# Patient Record
Sex: Female | Born: 1970 | Race: White | Hispanic: No | Marital: Married | Smoking: Never smoker
Health system: Southern US, Community
[De-identification: ages and names within clinical notes are randomized; demographics above are authoritative.]

## PROBLEM LIST (undated history)

## (undated) DIAGNOSIS — T7840XA Allergy, unspecified, initial encounter: Secondary | ICD-10-CM

## (undated) DIAGNOSIS — I10 Essential (primary) hypertension: Secondary | ICD-10-CM

## (undated) HISTORY — DX: Allergy, unspecified, initial encounter: T78.40XA

## (undated) HISTORY — DX: Essential (primary) hypertension: I10

## (undated) HISTORY — PX: LIPOSUCTION: SHX10

---

## 2007-04-13 ENCOUNTER — Ambulatory Visit: Payer: Self-pay | Admitting: Internal Medicine

## 2010-09-08 ENCOUNTER — Ambulatory Visit: Payer: Self-pay | Admitting: Internal Medicine

## 2011-04-28 ENCOUNTER — Ambulatory Visit: Payer: Self-pay

## 2012-01-20 ENCOUNTER — Emergency Department: Payer: Self-pay | Admitting: Emergency Medicine

## 2012-09-07 ENCOUNTER — Ambulatory Visit: Payer: Self-pay

## 2012-09-18 ENCOUNTER — Ambulatory Visit: Payer: Self-pay

## 2012-12-20 ENCOUNTER — Ambulatory Visit: Payer: Self-pay

## 2013-06-27 ENCOUNTER — Ambulatory Visit: Payer: Self-pay

## 2014-03-08 IMAGING — US ULTRASOUND RIGHT BREAST
1 series · 14 of 20 positions shown · non-contrast
Comparison: none

REASON FOR EXAM: av rt asymmetric density
COMMENTS:

PROCEDURE:     US  - US BREAST RIGHT  - September 18, 2012 [DATE]
RESULT:

[Series 1: ultrasound right breast · 0.08mm/px · 14 of 20 slices shown]
[im 1/20]
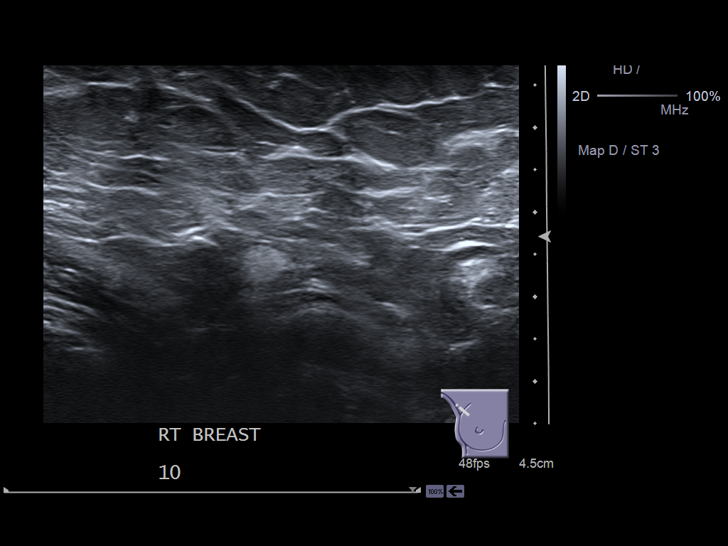
[im 3/20]
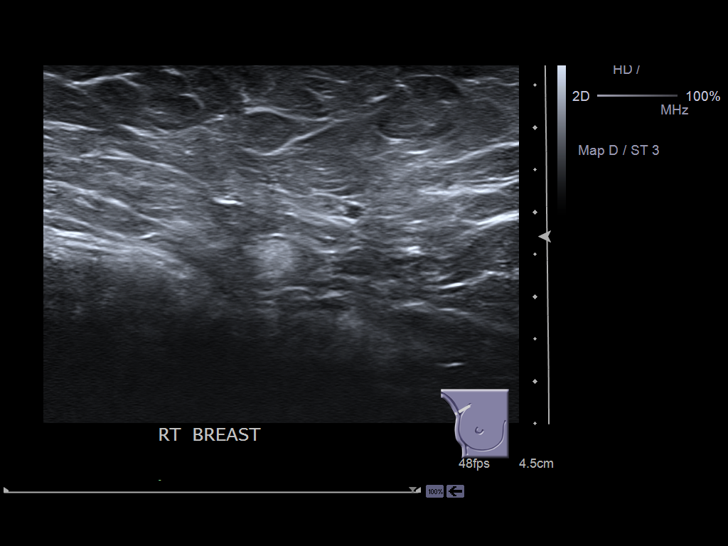
[im 4/20]
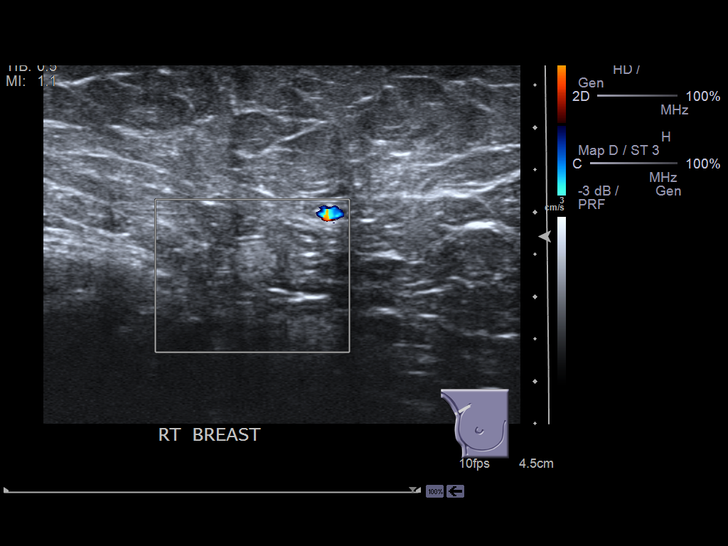
[im 6/20]
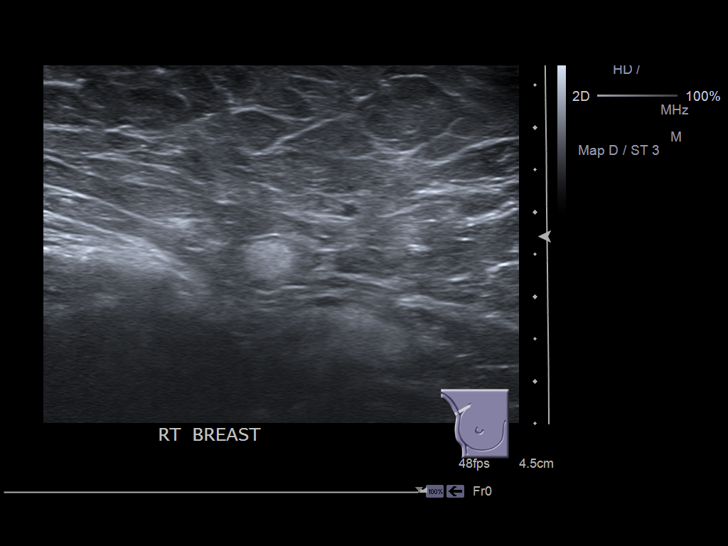
[im 7/20]
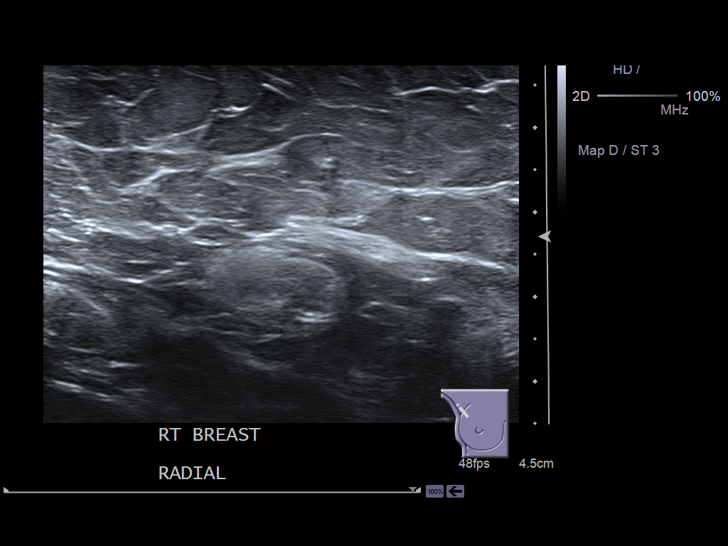
[im 8/20]
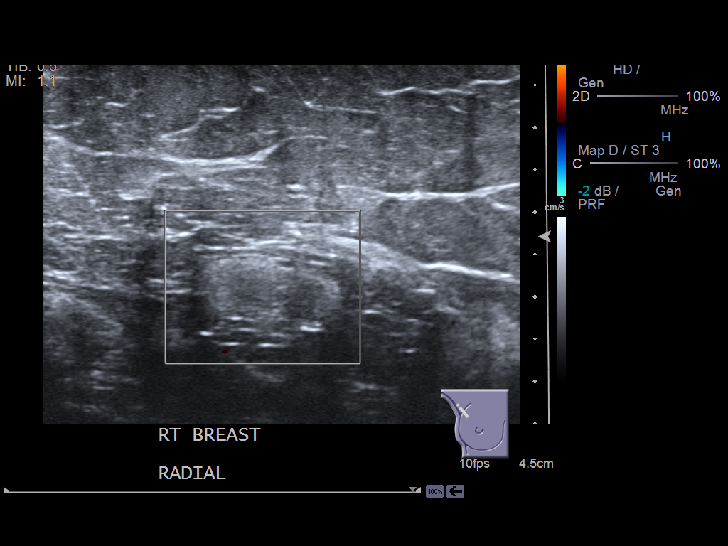
[im 10/20]
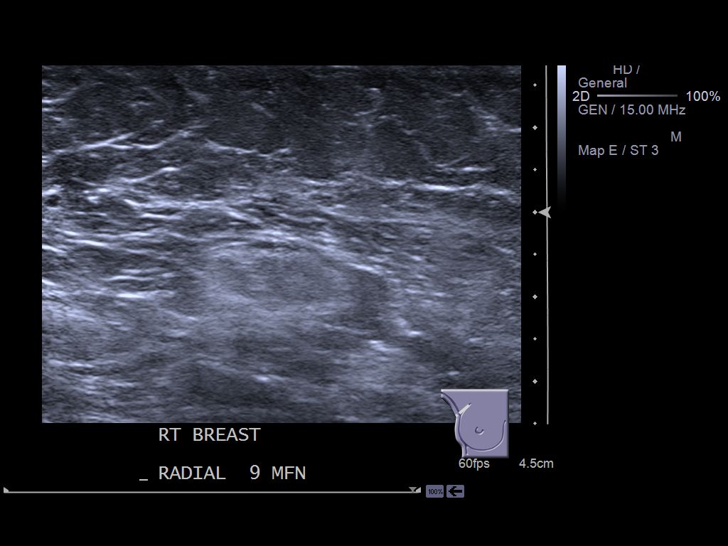
[im 11/20]
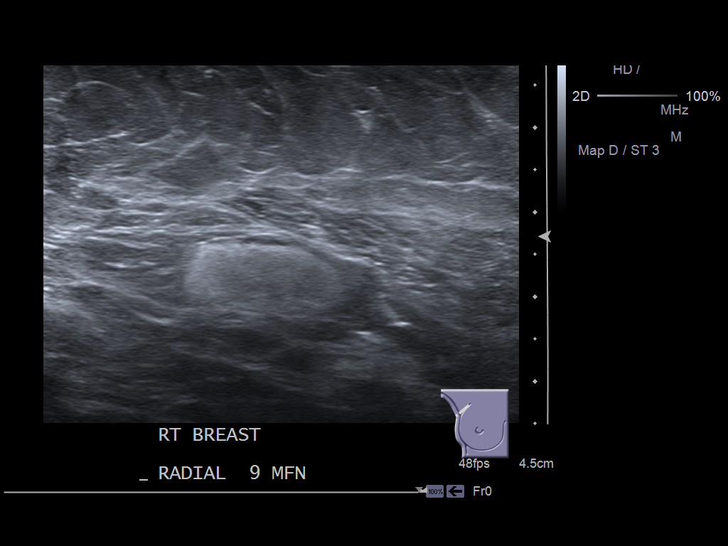
[im 13/20]
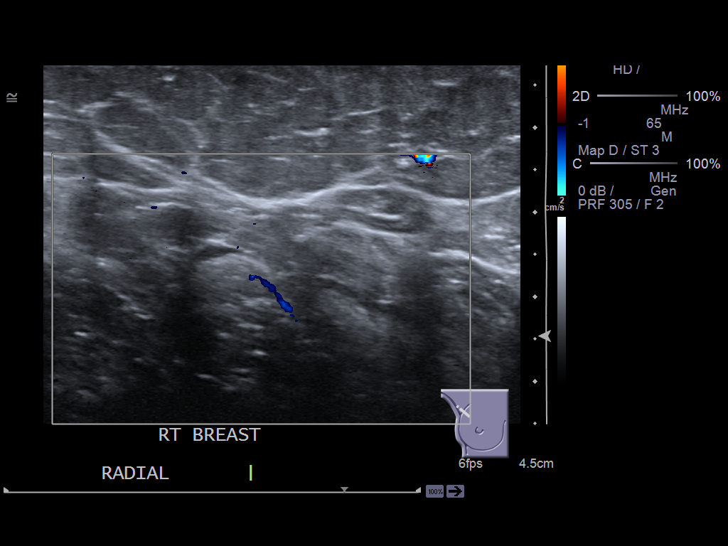
[im 14/20]
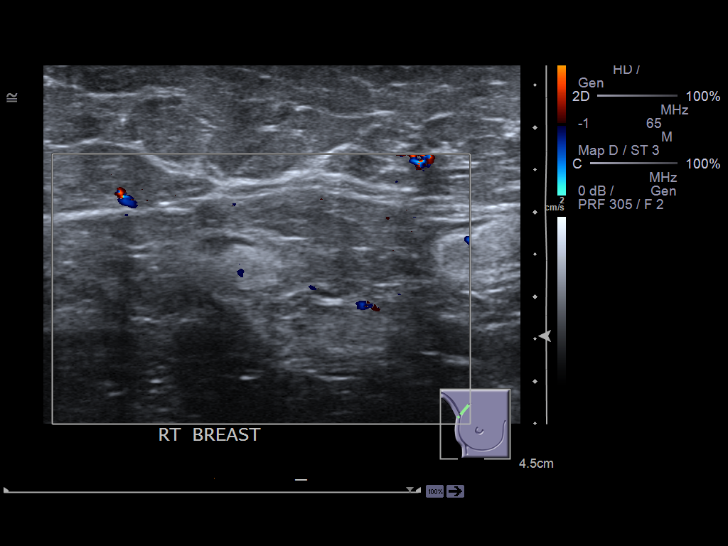
[im 16/20]
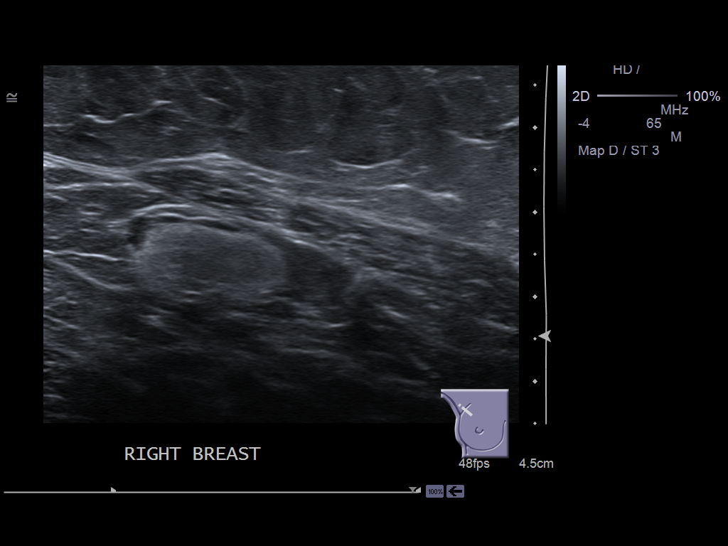
[im 17/20]
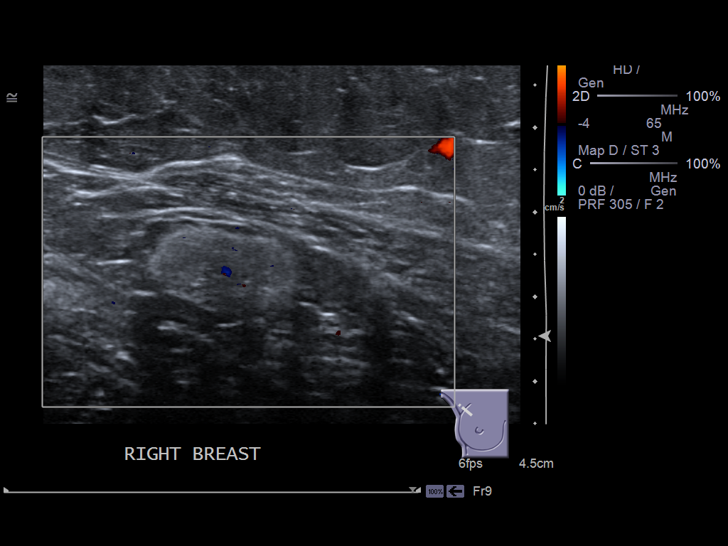
[im 18/20]
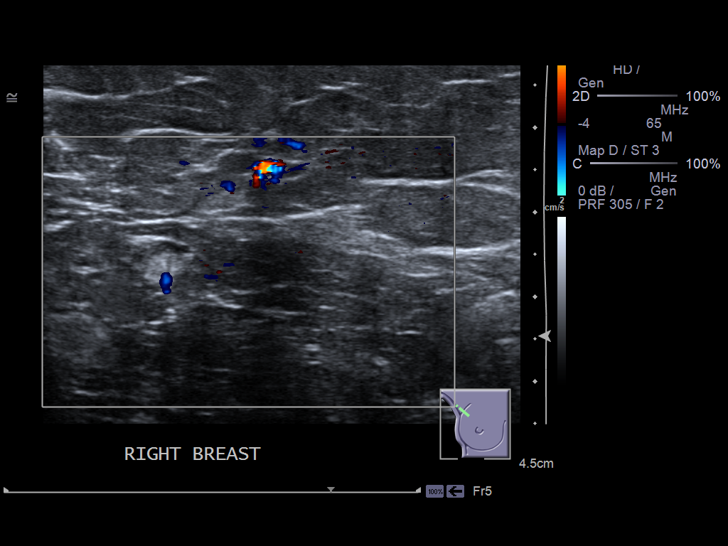
[im 20/20]
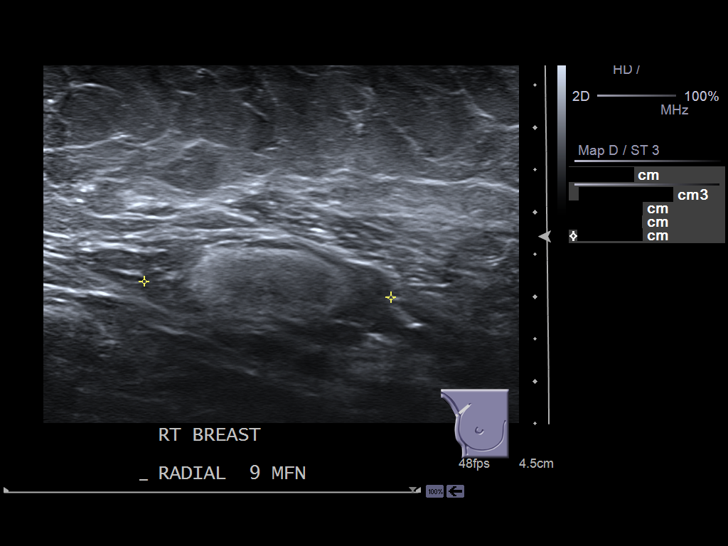

[14 of 20 positions shown; findings below may reference images not displayed]

FINDINGS: Evaluation of the right breast and the axillary tail region
demonstrates multiple lymph nodes. The largest measures 1.95 x 1.11 x
centimeters. The lymph nodes primarily have a benign appearance with a
hypoechoic border and echogenic nidus. The largest lymph node has slight
prominence of the echogenic nidus likely reflecting a benign finding. Due to
the slight prominence of the nidus a six month reevaluation with ultrasound
is recommended.
IMPRESSION: 1. Multiple lymph nodes within the region of interest as described above.
These are likely benign lymph nodes though slightly indeterminate
characteristics are appreciated within the largest lymph node and a six
month reevaluation is recommended.

## 2014-09-23 ENCOUNTER — Ambulatory Visit
Admission: RE | Admit: 2014-09-23 | Discharge: 2014-09-23 | Disposition: A | Discharge status: Home / Self Care | Payer: No Typology Code available for payment source | Source: Ambulatory Visit | Attending: Nurse Practitioner | Admitting: Nurse Practitioner

## 2014-09-23 ENCOUNTER — Other Ambulatory Visit: Payer: Self-pay | Admitting: Nurse Practitioner

## 2014-09-23 DIAGNOSIS — M25571 Pain in right ankle and joints of right foot: Secondary | ICD-10-CM | POA: Insufficient documentation

## 2014-09-23 DIAGNOSIS — Z87898 Personal history of other specified conditions: Secondary | ICD-10-CM | POA: Diagnosis present

## 2014-09-23 DIAGNOSIS — R52 Pain, unspecified: Secondary | ICD-10-CM

## 2014-12-15 IMAGING — US US BREAST*L* LIMITED INC AXILLA
1 series · 8 of 8 positions shown · non-contrast
Comparison: 12/20/2012, 09/07/2012 and earlier

CLINICAL DATA: One year follow-up for right breast nodule. The
patient has question of a mass in the 4 o'clock location of the left
breast on recent physical exam . She has also noted sharp shooting
pain in the lower outer quadrant of the left breast.

EXAM:
DIGITAL DIAGNOSTIC BILATERAL MAMMOGRAM WITH CAD
ULTRASOUND BILATERAL BREAST

[Series 1: us breast*left* limited inc axilla · 0.08mm/px · 8 of 8 slices shown]
[im 1/8]
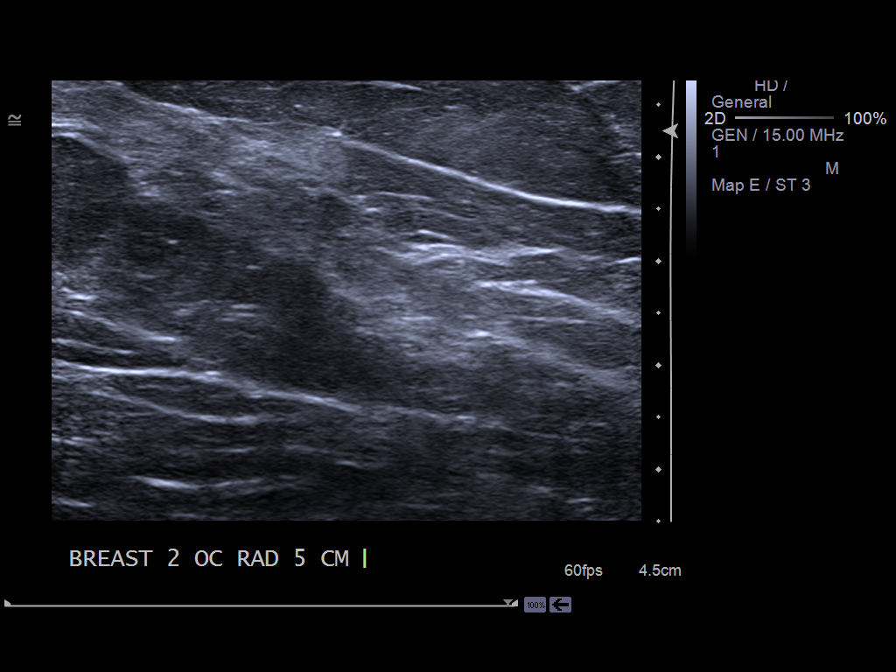
[im 2/8]
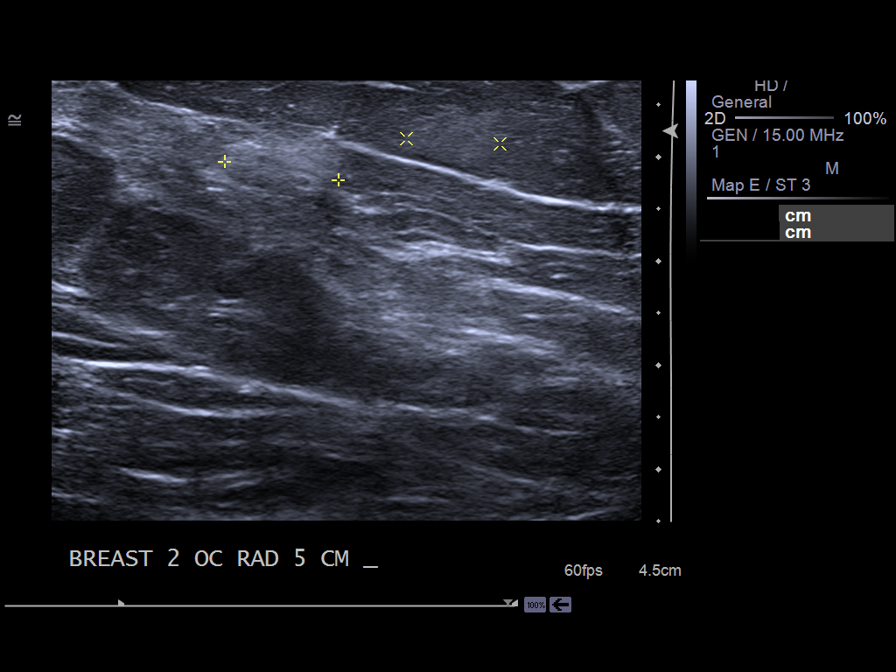
[im 3/8]
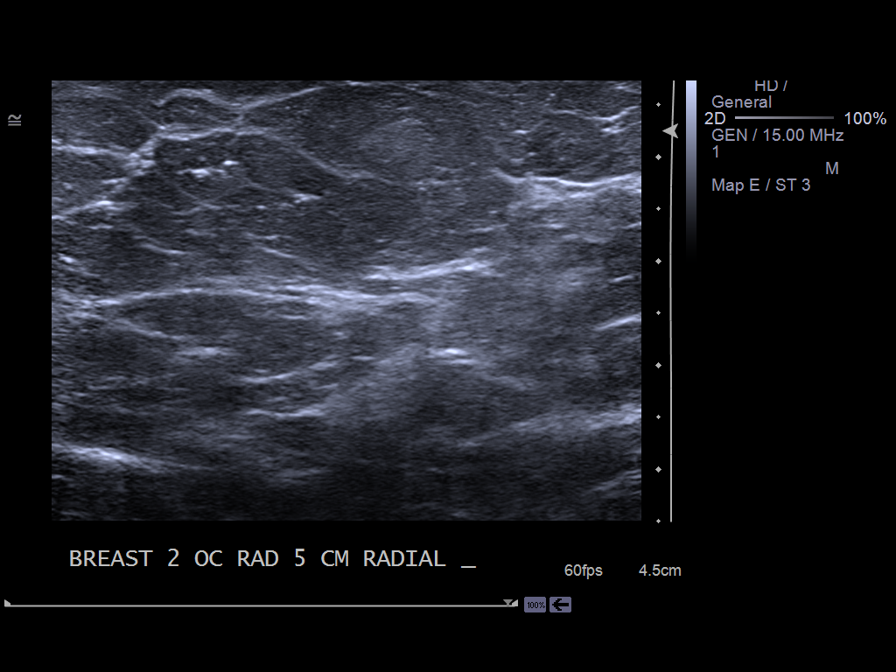
[im 4/8]
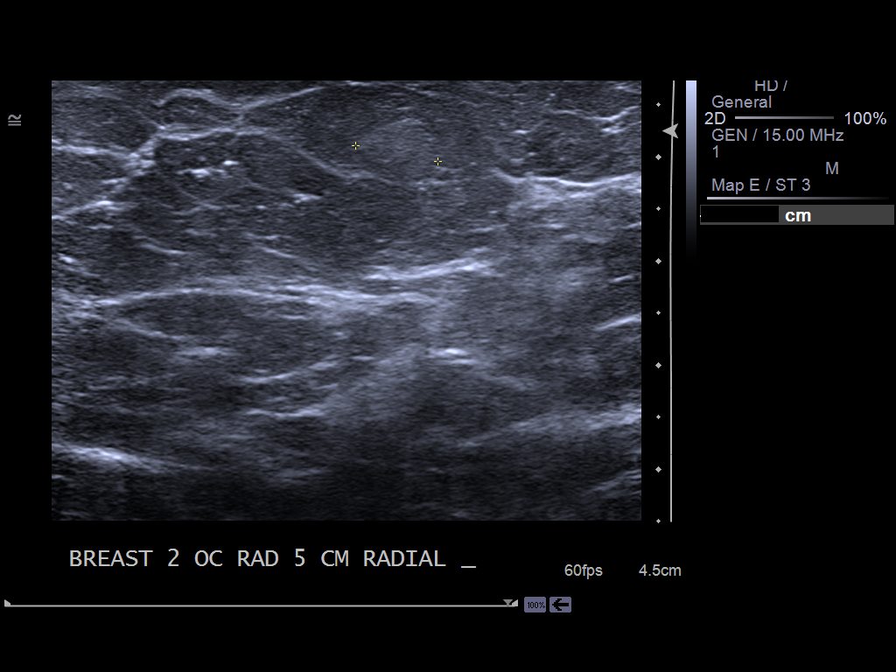
[im 5/8]
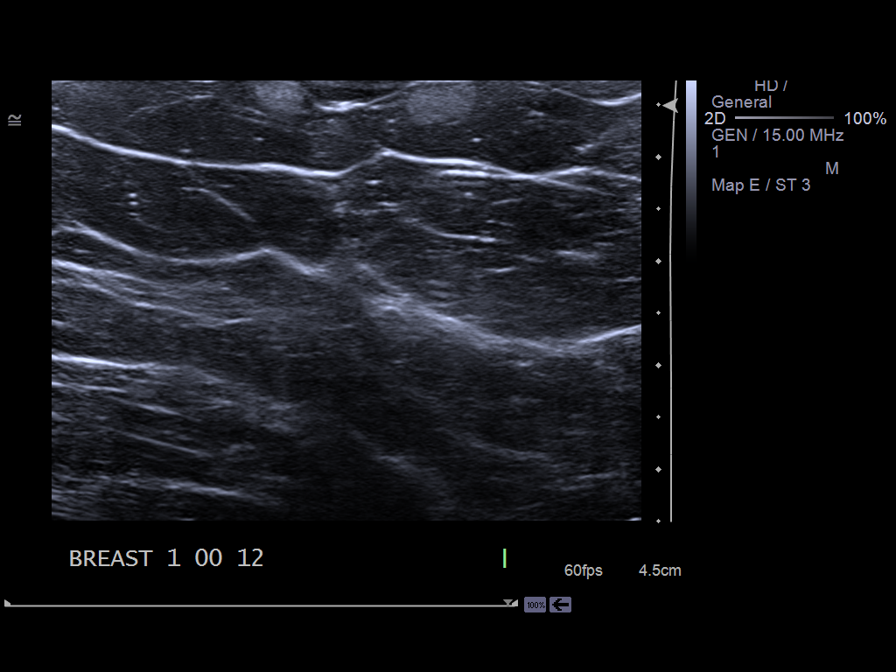
[im 6/8]
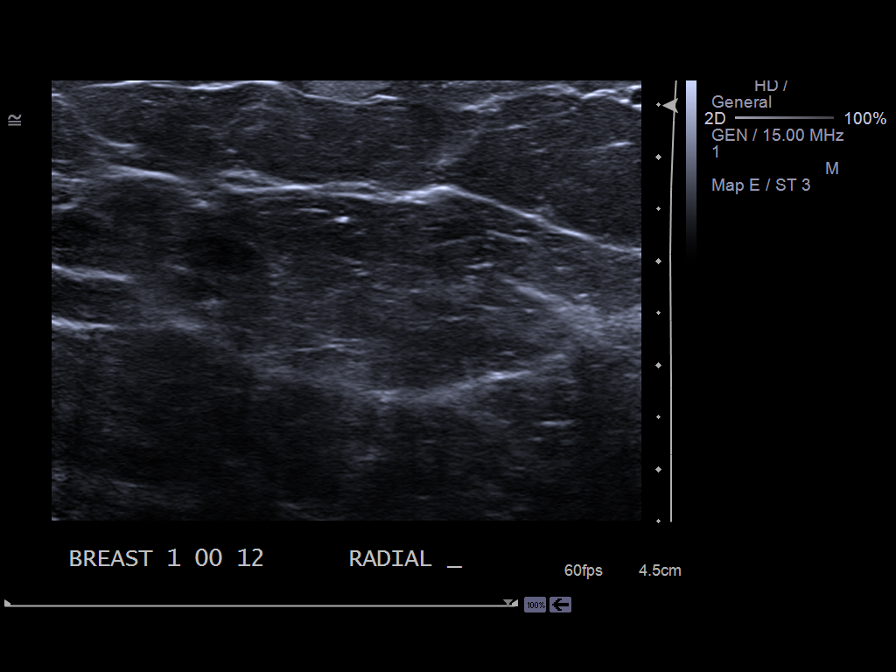
[im 7/8]
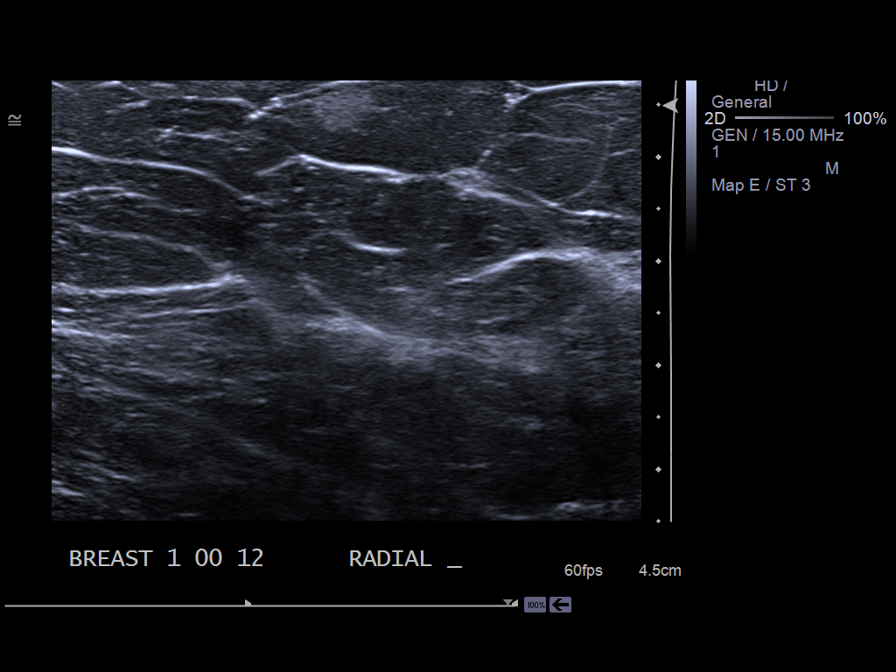
[im 8/8]
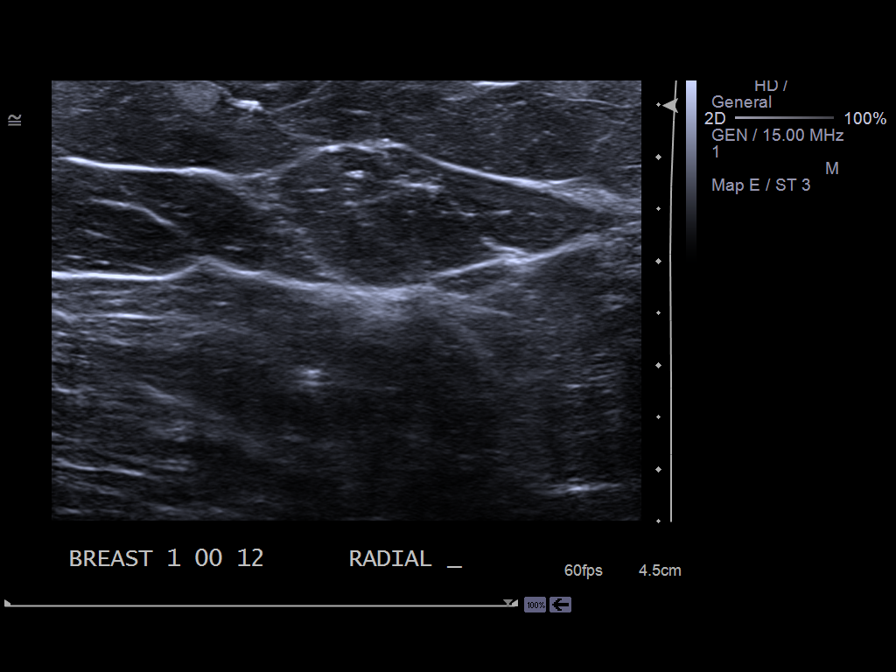

[8 of 8 positions shown; findings below may reference images not displayed]

ACR Breast Density Category b: There are scattered areas of
fibroglandular density.
FINDINGS: No suspicious mass, distortion, or microcalcifications are
identified to suggest presence of malignancy. Circumscribed nodule
previously noted in the far lateral portion of the right breast
consistent with intramammary lymph node appears slightly smaller
today and is further evaluated with ultrasound. In the left breast,
spot compression views of minimal asymmetry in the lateral aspect of
the breast show normal appearing fibroglandular tissue without mass
or distortion. No abnormality is identified in the lower outer
quadrant to correlate with the focal tenderness or questioned mass.

Mammographic images were processed with CAD.

On physical exam, I palpate no abnormality in the lower outer
quadrant of the left breast.

Ultrasound is performed, showing scattered hyperechoic nodules
throughout the upper-outer quadrant of the left breast. Findings are
consistent with benign lipomas, areas of fat necrosis, or
angiolipomas. No suspicious findings are identified in the upper
outer or lower outer quadrants.

Within the lateral aspect of the right breast, previously noted
intramammary lymph node is not imaged, consistent with isoechoic
cortex. No suspicious mass is identified in the lateral aspect of
the right breast.
IMPRESSION: 1. No mammographic or ultrasound evidence for malignancy.
2. Minimal asymmetry in the lateral portion of the left breast
likely represents benign breast tissue. Followup is recommended.
3. No specific follow-up is felt to be necessary for the
intramammary lymph node on the right.

RECOMMENDATION:
Followup left mammogram and possible ultrasound suggested in 6
months.

I have discussed the findings and recommendations with the patient.
Results were also provided in writing at the conclusion of the
visit. If applicable, a reminder letter will be sent to the patient
regarding the next appointment.

BI-RADS CATEGORY  3: Probably benign.

## 2016-01-12 ENCOUNTER — Ambulatory Visit
Admission: RE | Admit: 2016-01-12 | Discharge: 2016-01-12 | Disposition: A | Discharge status: Home / Self Care | Payer: Self-pay | Source: Ambulatory Visit | Attending: Nurse Practitioner | Admitting: Nurse Practitioner

## 2016-01-12 ENCOUNTER — Ambulatory Visit
Admission: RE | Admit: 2016-01-12 | Discharge: 2016-01-12 | Disposition: A | Discharge status: Home / Self Care | Payer: No Typology Code available for payment source | Source: Ambulatory Visit | Attending: Nurse Practitioner | Admitting: Nurse Practitioner

## 2016-01-12 ENCOUNTER — Other Ambulatory Visit: Payer: Self-pay | Admitting: Nurse Practitioner

## 2016-01-12 DIAGNOSIS — R52 Pain, unspecified: Secondary | ICD-10-CM | POA: Insufficient documentation

## 2016-08-12 ENCOUNTER — Other Ambulatory Visit: Payer: Self-pay | Admitting: Physician Assistant

## 2016-08-12 DIAGNOSIS — M533 Sacrococcygeal disorders, not elsewhere classified: Secondary | ICD-10-CM

## 2016-08-23 ENCOUNTER — Ambulatory Visit
Admission: RE | Admit: 2016-08-23 | Discharge: 2016-08-23 | Disposition: A | Discharge status: Home / Self Care | Payer: No Typology Code available for payment source | Source: Ambulatory Visit | Attending: Physician Assistant | Admitting: Physician Assistant

## 2016-08-23 DIAGNOSIS — M533 Sacrococcygeal disorders, not elsewhere classified: Secondary | ICD-10-CM | POA: Diagnosis not present

## 2016-08-23 DIAGNOSIS — D259 Leiomyoma of uterus, unspecified: Secondary | ICD-10-CM | POA: Diagnosis not present

## 2017-05-07 ENCOUNTER — Other Ambulatory Visit: Payer: Self-pay | Admitting: Internal Medicine

## 2017-05-30 ENCOUNTER — Encounter: Payer: Self-pay | Admitting: Nurse Practitioner

## 2017-05-30 ENCOUNTER — Ambulatory Visit: Payer: No Typology Code available for payment source | Admitting: Nurse Practitioner

## 2017-05-30 VITALS — BP 126/86 | HR 80 | Resp 16 | Ht 62.0 in | Wt 223.4 lb

## 2017-05-30 DIAGNOSIS — R002 Palpitations: Secondary | ICD-10-CM

## 2017-05-30 DIAGNOSIS — R7301 Impaired fasting glucose: Secondary | ICD-10-CM

## 2017-05-30 DIAGNOSIS — E559 Vitamin D deficiency, unspecified: Secondary | ICD-10-CM

## 2017-05-30 DIAGNOSIS — Z1239 Encounter for other screening for malignant neoplasm of breast: Secondary | ICD-10-CM

## 2017-05-30 DIAGNOSIS — R5382 Chronic fatigue, unspecified: Secondary | ICD-10-CM

## 2017-05-30 DIAGNOSIS — Z1231 Encounter for screening mammogram for malignant neoplasm of breast: Secondary | ICD-10-CM

## 2017-05-30 DIAGNOSIS — I10 Essential (primary) hypertension: Secondary | ICD-10-CM

## 2017-05-30 DIAGNOSIS — R635 Abnormal weight gain: Secondary | ICD-10-CM

## 2017-05-30 DIAGNOSIS — R0602 Shortness of breath: Secondary | ICD-10-CM

## 2017-05-30 MED ORDER — ATENOLOL 25 MG PO TABS: 25.0000 mg | ORAL_TABLET | Freq: Every day | ORAL | 3 refills | Status: DC

## 2017-05-30 NOTE — Progress Notes (Signed)
[facility] [place], [place] [address]  Internal MEDICINE  Office Visit Note  Patient Name: Janet Henry  [id number]  [id number]  Date of Service: 06/19/2017  Chief Complaint  Patient presents with  . Fatigue  . Shortness of Breath    The patient is here for routine follow up exam. Today she is c/o increased fatigue and has some shortness of breath, especially with exertion. These symptoms have been present for the past several weeks and seem to be gradually getting worse. She cannott thik of anything else which makes the symptoms worse. Rest makes them slightly better.  She does have well controlled htn. Currently on atenolol every day.    Pt is here for routine follow up.    Current Medication: Outpatient Encounter Medications as of 05/30/2017  Medication Sig  . atenolol (TENORMIN) 25 MG tablet Take 1 tablet (25 mg total) by mouth at bedtime.  . [DISCONTINUED] atenolol (TENORMIN) 25 MG tablet TAKE 1 TABLET BY MOUTH AT BEDTIME   No facility-administered encounter medications on file as of 05/30/2017.     Surgical History: Past Surgical History:  Procedure Laterality Date  . CESAREAN SECTION      Medical History: Past Medical History:  Diagnosis Date  . Allergy   . Hypertension     Family History: Family History  Problem Relation Age of Onset  . Heart disease Mother   . Hypertension Father     Social History   Socioeconomic History  . Marital status: Married    Spouse name: Not on file  . Number of children: Not on file  . Years of education: Not on file  . Highest education level: Not on file  Occupational History  . Not on file  Social Needs  . Financial resource strain: Not on file  . Food insecurity:    Worry: Not on file    Inability: Not on file  . Transportation needs:    Medical: Not on file    Non-medical: Not on file  Tobacco Use  . Smoking status: Never Smoker  . Smokeless tobacco: Never Used  Substance and  Sexual Activity  . Alcohol use: No    Frequency: Never  . Drug use: No  . Sexual activity: Not on file  Lifestyle  . Physical activity:    Days per week: Not on file    Minutes per session: Not on file  . Stress: Not on file  Relationships  . Social connections:    Talks on phone: Not on file    Gets together: Not on file    Attends religious service: Not on file    Active member of club or organization: Not on file    Attends meetings of clubs or organizations: Not on file    Relationship status: Not on file  . Intimate partner violence:    Fear of current or ex partner: Not on file    Emotionally abused: Not on file    Physically abused: Not on file    Forced sexual activity: Not on file  Other Topics Concern  . Not on file  Social History Narrative  . Not on file      Review of Systems  Constitutional: Positive for fatigue. Negative for activity change, chills and unexpected weight change.  HENT: Negative for congestion, postnasal drip, rhinorrhea, sneezing and sore throat.   Eyes: Negative.  Negative for redness.  Respiratory: Positive for shortness of breath. Negative for cough and chest  tightness.   Cardiovascular: Positive for palpitations. Negative for chest pain.  Gastrointestinal: Negative for abdominal pain, constipation, diarrhea, nausea and vomiting.  Endocrine: Negative for cold intolerance, heat intolerance, polydipsia, polyphagia and polyuria.  Genitourinary: Negative for dysuria, flank pain and frequency.  Musculoskeletal: Positive for back pain. Negative for arthralgias, joint swelling and neck pain.  Skin: Negative for rash.  Allergic/Immunologic: Negative for environmental allergies.  Neurological: Positive for headaches. Negative for tremors and numbness.  Hematological: Negative for adenopathy. Does not bruise/bleed easily.  Psychiatric/Behavioral: Negative for behavioral problems (Depression), sleep disturbance and suicidal ideas. The patient is  nervous/anxious.    Today's Vitals   05/30/17 1116  BP: 126/86  Pulse: 80  Resp: 16  SpO2: 95%  Weight: 223 lb 6.4 oz (101.3 kg)  Height: 5' 2"  (1.575 m)    Physical Exam  Constitutional: She is oriented to person, place, and time. She appears well-developed and well-nourished. No distress.  HENT:  Head: Normocephalic and atraumatic.  Mouth/Throat: Oropharynx is clear and moist. No oropharyngeal exudate.  Eyes: Pupils are equal, round, and reactive to light. EOM are normal.  Neck: Normal range of motion. Neck supple. No JVD present. No tracheal deviation present. No thyromegaly present.  Cardiovascular: Normal rate, regular rhythm and normal heart sounds. Exam reveals no gallop and no friction rub.  No murmur heard. ECg today showing no cute abnormalities or ischemia. There is low-voltage in precordial leads.   Pulmonary/Chest: Effort normal and breath sounds normal. No respiratory distress. She has no wheezes. She has no rales. She exhibits no tenderness.  Abdominal: Soft. Bowel sounds are normal. There is no tenderness.  Musculoskeletal: Normal range of motion.  Lymphadenopathy:    She has no cervical adenopathy.  Neurological: She is alert and oriented to person, place, and time. No cranial nerve deficit.  Skin: Skin is warm and dry. She is not diaphoretic.  Psychiatric: She has a normal mood and affect. Her behavior is normal. Judgment and thought content normal.  Nursing note and vitals reviewed.   Assessment/Plan: 1. Essential hypertension Stable. Continue bp medication as prescribed  - atenolol (TENORMIN) 25 MG tablet; Take 1 tablet (25 mg total) by mouth at bedtime.  Dispense: 30 tablet; Refill: 3  2. Shortness of breath ECG today showing no acute abnormalities. Is low-voltage precordil leads. Will get echo annd check labs for further evaluation.  - CBC with Differential/Platelet - Comprehensive metabolic panel - Iron, TIBC and Ferritin Panel - Vitamin B12 -  Folate - ECHOCARDIOGRAM COMPLETE; Future - EKG 12-Lead  3. Palpitations - Iron, TIBC and Ferritin Panel - Vitamin B12 - ECHOCARDIOGRAM COMPLETE; Future - EKG 12-Lead  4. Chronic fatigue - CBC with Differential/Platelet - Comprehensive metabolic panel - Lipid panel - TSH - T4, free - Iron, TIBC and Ferritin Panel - Vitamin B12 - Folate - HgB A1c  5. Impaired fasting glucose - HgB A1c  6. Abnormal weight gain - Lipid panel - TSH - T4, free - HgB A1c  7. Vitamin D deficiency - Vitamin D 1,25 dihydroxy  8. Screening for breast cancer - MM DIAG BREAST TOMO BILATERAL; Future  General Counseling: lindalou soltis understanding of the findings of todays visit and agrees with plan of treatment. I have discussed any further diagnostic evaluation that may be needed or ordered today. We also reviewed her medications today. she has been encouraged to call the office with any questions or concerns that should arise related to todays visit.   This patient was seen by Nira Conn  Junius Creamer, [place] in Collaboration with Dr Lavera Guise as a part of collaborative care agreement    Orders Placed This Encounter  Procedures  . MM DIAG BREAST TOMO BILATERAL  . CBC with Differential/Platelet  . Comprehensive metabolic panel  . Lipid panel  . TSH  . T4, free  . Vitamin D 1,25 dihydroxy  . Iron, TIBC and Ferritin Panel  . Vitamin B12  . Folate  . HgB A1c  . EKG 12-Lead  . ECHOCARDIOGRAM COMPLETE    Meds ordered this encounter  Medications  . atenolol (TENORMIN) 25 MG tablet    Sig: Take 1 tablet (25 mg total) by mouth at bedtime.    Dispense:  30 tablet    Refill:  3    Order Specific Question:   Supervising Provider    Answer:   Lavera Guise [[id number]]    Time spent: 15 Minutes      Dr Lavera Guise Internal medicine

## 2017-06-01 LAB — HEMOGLOBIN A1C
ESTIMATED AVERAGE GLUCOSE: 105 mg/dL
Hgb A1c MFr Bld: 5.3 % (ref 4.8–5.6)

## 2017-06-05 LAB — CBC WITH DIFFERENTIAL/PLATELET
BASOS ABS: 0 10*3/uL (ref 0.0–0.2)
Basos: 0 %
EOS (ABSOLUTE): 0.1 10*3/uL (ref 0.0–0.4)
Eos: 2 %
HEMOGLOBIN: 12.8 g/dL (ref 11.1–15.9)
Hematocrit: 38.1 % (ref 34.0–46.6)
IMMATURE GRANS (ABS): 0 10*3/uL (ref 0.0–0.1)
Immature Granulocytes: 0 %
LYMPHS: 27 %
Lymphocytes Absolute: 2.2 10*3/uL (ref 0.7–3.1)
MCH: 27.7 pg (ref 26.6–33.0)
MCHC: 33.6 g/dL (ref 31.5–35.7)
MCV: 83 fL (ref 79–97)
MONOCYTES: 8 %
Monocytes Absolute: 0.6 10*3/uL (ref 0.1–0.9)
NEUTROS PCT: 63 %
Neutrophils Absolute: 5.2 10*3/uL (ref 1.4–7.0)
Platelets: 289 10*3/uL (ref 150–379)
RBC: 4.62 x10E6/uL (ref 3.77–5.28)
RDW: 17.2 % — ABNORMAL HIGH (ref 12.3–15.4)
WBC: 8.2 10*3/uL (ref 3.4–10.8)

## 2017-06-05 LAB — T4, FREE: Free T4: 1.09 ng/dL (ref 0.82–1.77)

## 2017-06-05 LAB — COMPREHENSIVE METABOLIC PANEL
ALBUMIN: 4.1 g/dL (ref 3.5–5.5)
ALK PHOS: 68 IU/L (ref 39–117)
ALT: 17 IU/L (ref 0–32)
AST: 17 IU/L (ref 0–40)
Albumin/Globulin Ratio: 1.5 (ref 1.2–2.2)
BUN / CREAT RATIO: 16 (ref 9–23)
BUN: 10 mg/dL (ref 6–24)
Bilirubin Total: 0.7 mg/dL (ref 0.0–1.2)
CO2: 23 mmol/L (ref 20–29)
CREATININE: 0.64 mg/dL (ref 0.57–1.00)
Calcium: 9.2 mg/dL (ref 8.7–10.2)
Chloride: 102 mmol/L (ref 96–106)
GFR calc Af Amer: 124 mL/min/{1.73_m2} (ref >59)
GFR calc non Af Amer: 107 mL/min/{1.73_m2} (ref >59)
GLUCOSE: 89 mg/dL (ref 65–99)
Globulin, Total: 2.8 g/dL (ref 1.5–4.5)
Potassium: 4.7 mmol/L (ref 3.5–5.2)
Sodium: 138 mmol/L (ref 134–144)
Total Protein: 6.9 g/dL (ref 6.0–8.5)

## 2017-06-05 LAB — VITAMIN D 1,25 DIHYDROXY
VITAMIN D3 1, 25 (OH): 39 pg/mL
Vitamin D 1, 25 (OH)2 Total: 40 pg/mL
Vitamin D2 1, 25 (OH)2: <10 pg/mL

## 2017-06-05 LAB — FOLATE: FOLATE: 11.2 ng/mL (ref >3.0)

## 2017-06-05 LAB — IRON,TIBC AND FERRITIN PANEL
Ferritin: 90 ng/mL (ref 15–150)
IRON: 61 ug/dL (ref 27–159)
Iron Saturation: 23 % (ref 15–55)
TIBC: 262 ug/dL (ref 250–450)
UIBC: 201 ug/dL (ref 131–425)

## 2017-06-05 LAB — LIPID PANEL
CHOLESTEROL TOTAL: 224 mg/dL — AB (ref 100–199)
Chol/HDL Ratio: 5.9 ratio — ABNORMAL HIGH (ref 0.0–4.4)
HDL: 38 mg/dL — AB (ref >39)
LDL CALC: 152 mg/dL — AB (ref 0–99)
Triglycerides: 169 mg/dL — ABNORMAL HIGH (ref 0–149)
VLDL CHOLESTEROL CAL: 34 mg/dL (ref 5–40)

## 2017-06-05 LAB — TSH: TSH: 2.08 u[IU]/mL (ref 0.450–4.500)

## 2017-06-05 LAB — VITAMIN B12: Vitamin B-12: 378 pg/mL (ref 232–1245)

## 2017-06-06 ENCOUNTER — Other Ambulatory Visit: Payer: Self-pay | Admitting: Nurse Practitioner

## 2017-06-06 DIAGNOSIS — N6489 Other specified disorders of breast: Secondary | ICD-10-CM

## 2017-06-17 ENCOUNTER — Telehealth: Payer: Self-pay

## 2017-06-17 ENCOUNTER — Ambulatory Visit: Payer: PRIVATE HEALTH INSURANCE

## 2017-06-17 DIAGNOSIS — R002 Palpitations: Secondary | ICD-10-CM | POA: Diagnosis not present

## 2017-06-17 DIAGNOSIS — R0602 Shortness of breath: Secondary | ICD-10-CM

## 2017-06-17 NOTE — Telephone Encounter (Signed)
Spoke to pt about lab results.  dbs

## 2017-06-17 NOTE — Telephone Encounter (Signed)
Overall, labs were decent. Cholesterol was generally elevated. All other labs were ok. Did someone give her the results?

## 2017-06-17 NOTE — Telephone Encounter (Signed)
Note sent to provider

## 2017-06-19 DIAGNOSIS — R0602 Shortness of breath: Secondary | ICD-10-CM | POA: Insufficient documentation

## 2017-06-19 DIAGNOSIS — I10 Essential (primary) hypertension: Secondary | ICD-10-CM | POA: Insufficient documentation

## 2017-06-19 DIAGNOSIS — R635 Abnormal weight gain: Secondary | ICD-10-CM | POA: Insufficient documentation

## 2017-06-19 DIAGNOSIS — R5382 Chronic fatigue, unspecified: Secondary | ICD-10-CM | POA: Insufficient documentation

## 2017-06-19 DIAGNOSIS — Z1239 Encounter for other screening for malignant neoplasm of breast: Secondary | ICD-10-CM | POA: Insufficient documentation

## 2017-06-19 DIAGNOSIS — R7301 Impaired fasting glucose: Secondary | ICD-10-CM | POA: Insufficient documentation

## 2017-06-19 DIAGNOSIS — R002 Palpitations: Secondary | ICD-10-CM | POA: Insufficient documentation

## 2017-06-19 DIAGNOSIS — E559 Vitamin D deficiency, unspecified: Secondary | ICD-10-CM | POA: Insufficient documentation

## 2017-07-01 IMAGING — CR DG SACRUM/COCCYX 2+V
1 series · 3 of 3 positions shown · non-contrast
Comparison: Limited views of the sacrum from an upper GI series of
September 08, 2010

CLINICAL DATA: One year history of sacral and coccygeal pain with
increasing symptoms over the past 5 months. Patient reports pain
when sitting and arising.

EXAM:
SACRUM AND COCCYX - 2+ VIEW

[Series 1: dg sacrum/coccyx · 0.14mm/px · 3 of 3 slices shown]
[im 1/3]
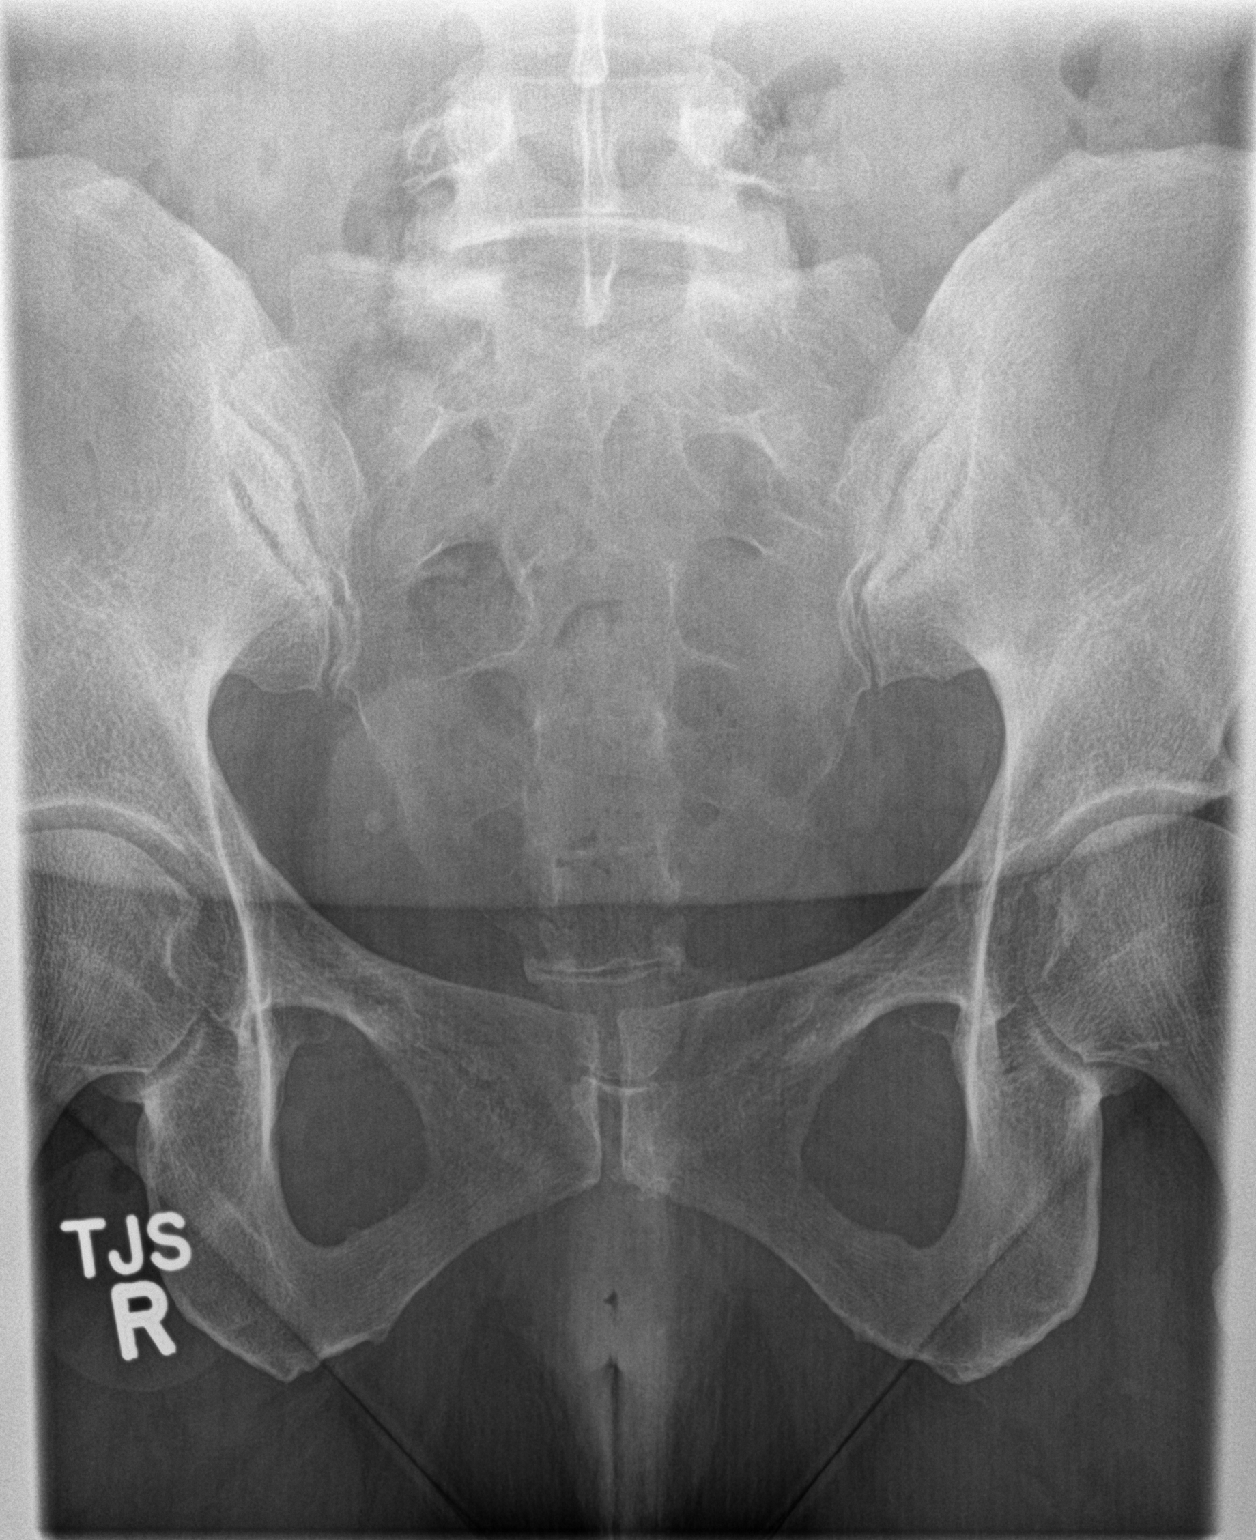
[im 2/3]
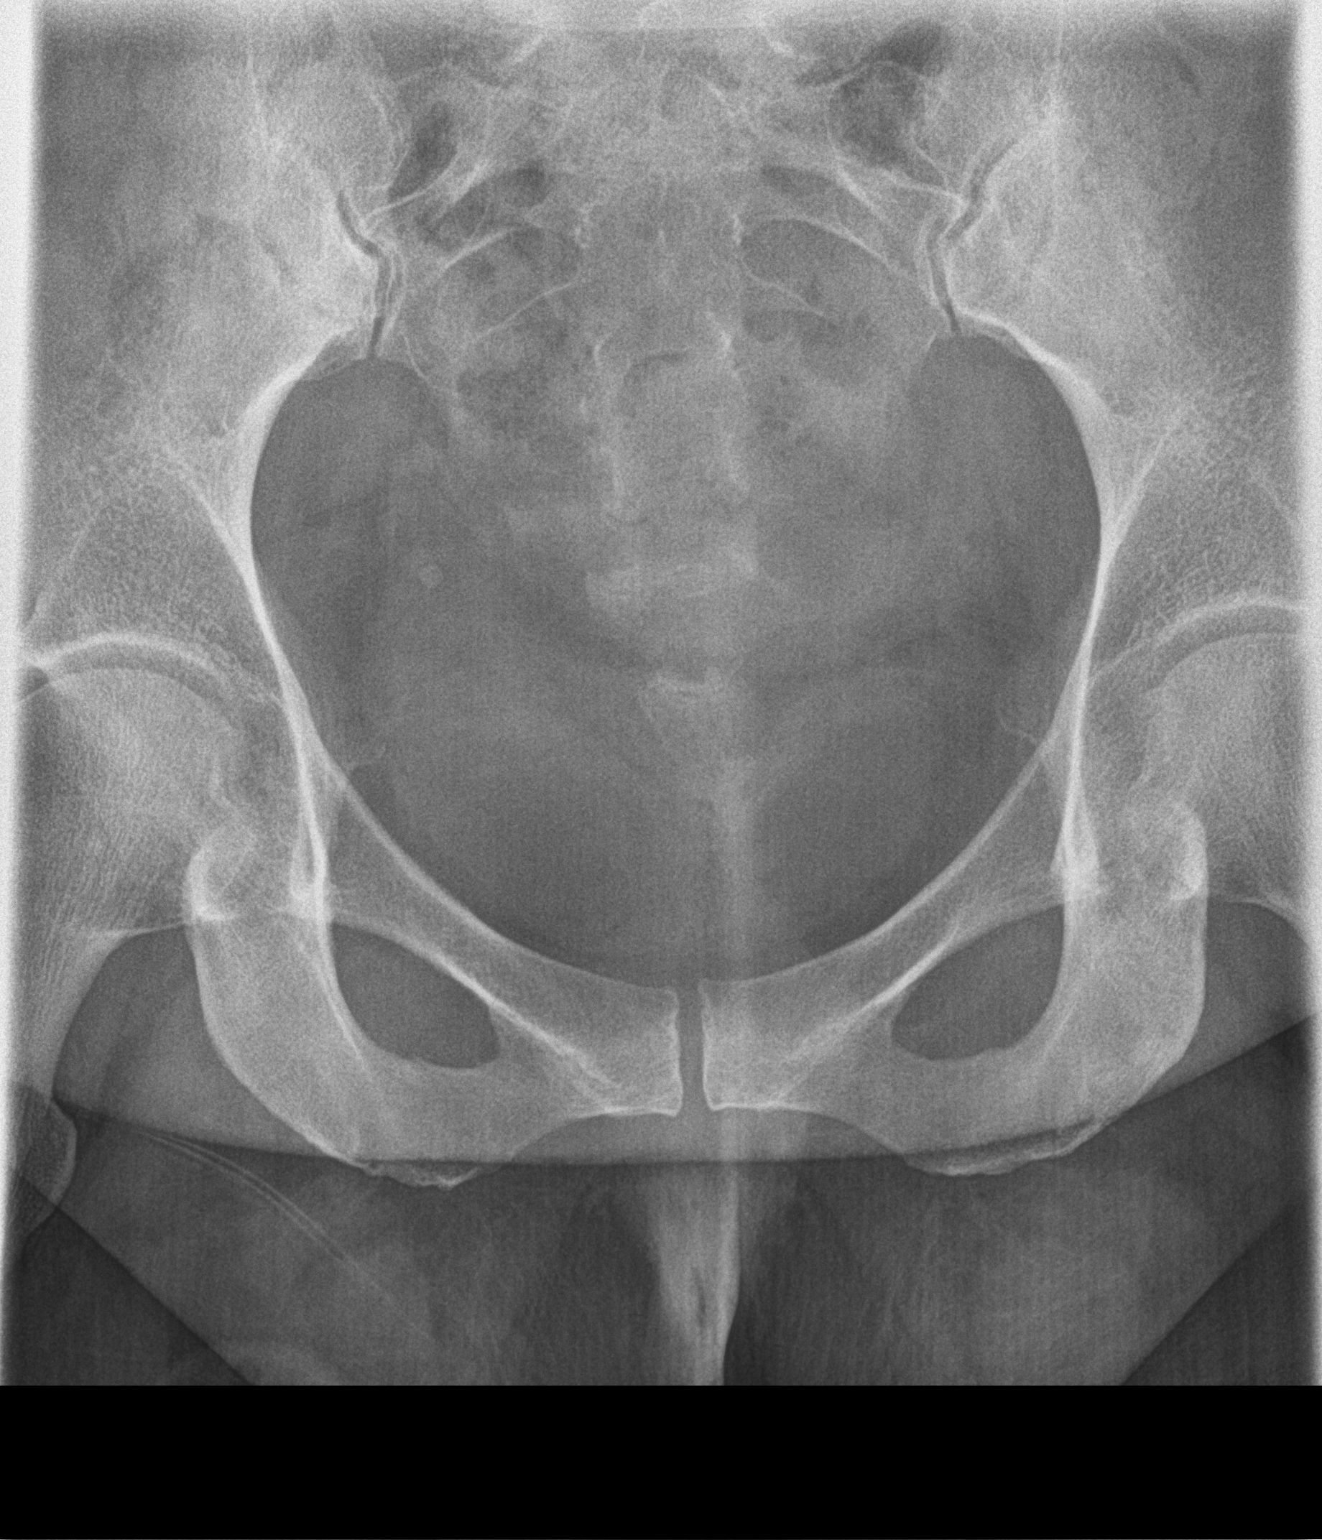
[im 3/3]
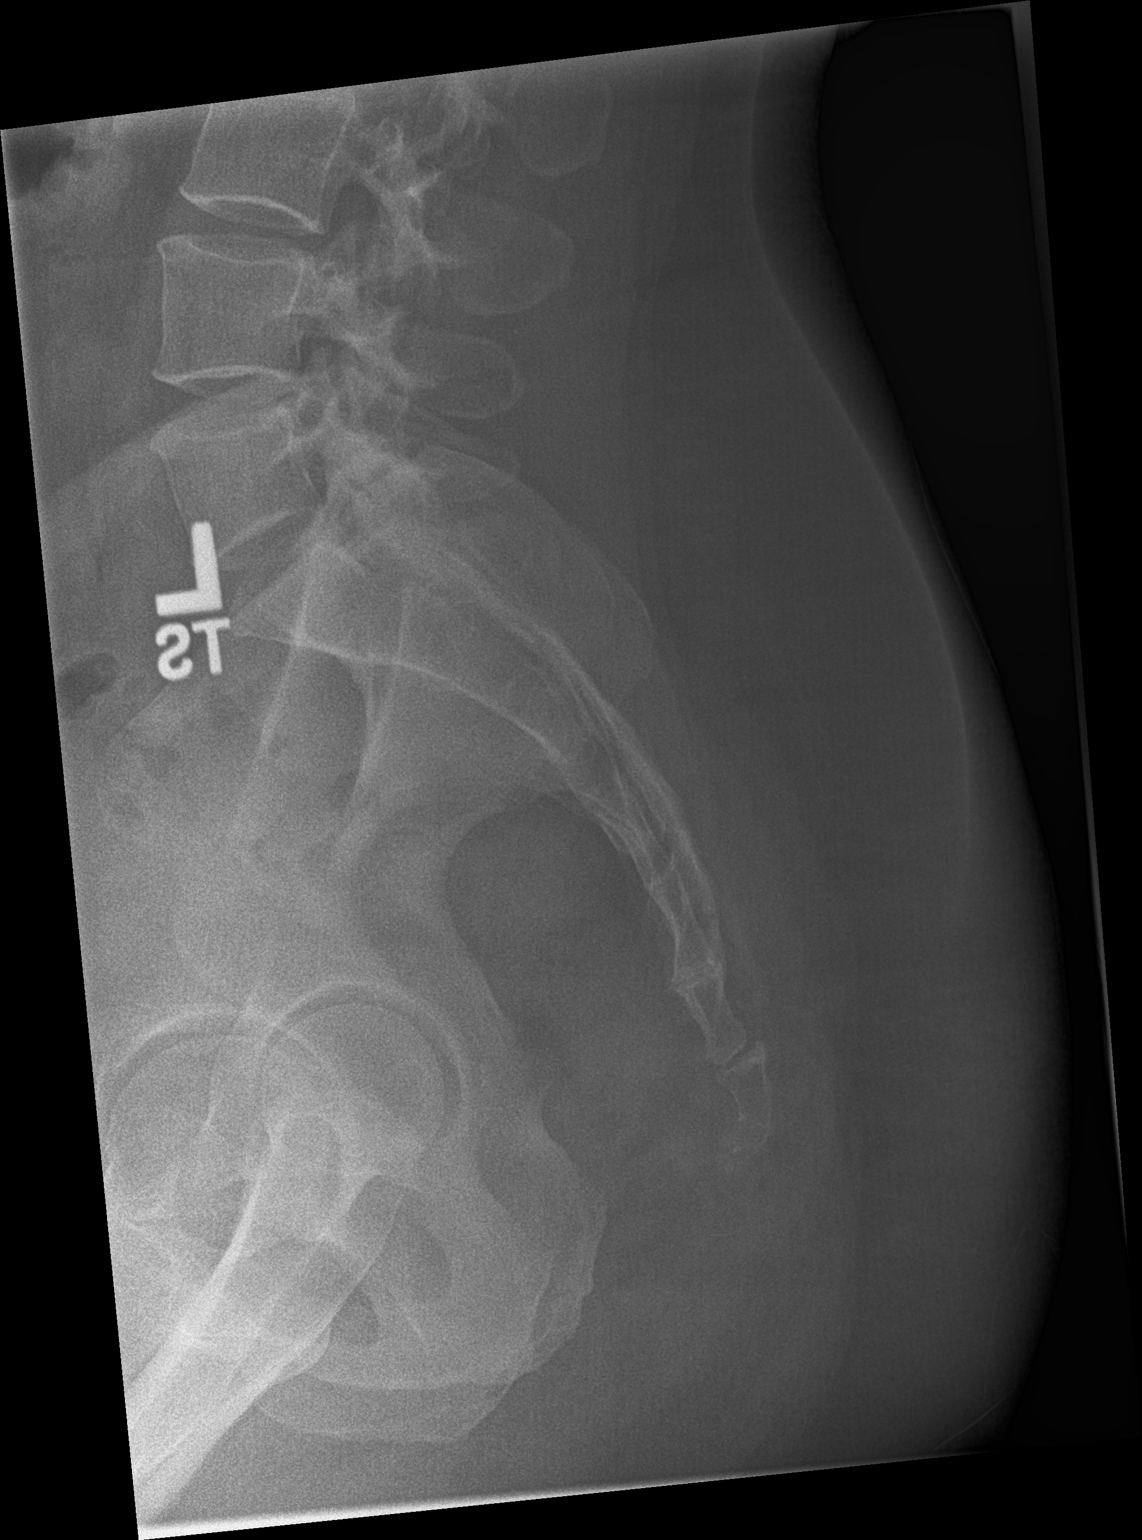

[3 of 3 positions shown; findings below may reference images not displayed]

FINDINGS: The bones are subjectively adequately mineralized. There are least 4
intact sacral struts. The SI joints appear normal. On the lateral
view the sacrum and coccyx appear intact. The presacral soft tissues
are normal..
IMPRESSION: And there is no acute bony abnormality of the sacrum or coccyx.
Given the chronic symptoms, MRI of the sacrum and coccyx may be a
useful next imaging step.

## 2017-07-15 ENCOUNTER — Ambulatory Visit: Payer: PRIVATE HEALTH INSURANCE | Admitting: Nurse Practitioner

## 2017-07-15 ENCOUNTER — Encounter: Payer: Self-pay | Admitting: Nurse Practitioner

## 2017-07-15 VITALS — BP 142/80 | HR 76 | Resp 16 | Ht 62.0 in | Wt 227.4 lb

## 2017-07-15 DIAGNOSIS — R002 Palpitations: Secondary | ICD-10-CM | POA: Diagnosis not present

## 2017-07-15 DIAGNOSIS — R5382 Chronic fatigue, unspecified: Secondary | ICD-10-CM | POA: Diagnosis not present

## 2017-07-15 DIAGNOSIS — R05 Cough: Secondary | ICD-10-CM | POA: Diagnosis not present

## 2017-07-15 DIAGNOSIS — R059 Cough, unspecified: Secondary | ICD-10-CM

## 2017-07-15 DIAGNOSIS — I10 Essential (primary) hypertension: Secondary | ICD-10-CM | POA: Diagnosis not present

## 2017-07-15 MED ORDER — BENZONATATE 200 MG PO CAPS: 200.0000 mg | ORAL_CAPSULE | Freq: Two times a day (BID) | ORAL | 0 refills | Status: DC | PRN

## 2017-07-15 NOTE — Progress Notes (Signed)
[facility] [place], [place] [address]  Internal MEDICINE  Office Visit Note  Patient Name: Janet Henry  [id number]  [id number]  Date of Service: 08/08/2017    Pt is here for routine follow up.   Chief Complaint  Patient presents with  . review echo/mammo results  . Cough    drainage, yellow mucus    The patient is here for routine follow up exam. Continues to have moderate fatigue. Shortness of breath has improved. She had labs and echocardiogram done since her last visit and is here to review all results.  She has also seen provider at urgent care for sinusitis. Was put on antibiotics and is still taking them. She does feel some better, but she continues to have a moderate cough. Cough is dry and non productive, but is keeping her awake during the night.      Current Medication: Outpatient Encounter Medications as of 07/15/2017  Medication Sig  . atenolol (TENORMIN) 25 MG tablet Take 1 tablet (25 mg total) by mouth at bedtime.  . benzonatate (TESSALON) 200 MG capsule Take 1 capsule (200 mg total) by mouth 2 (two) times daily as needed for cough.  . fluticasone (FLONASE) 50 MCG/ACT nasal spray Place into the nose.   No facility-administered encounter medications on file as of 07/15/2017.     Surgical History: Past Surgical History:  Procedure Laterality Date  . CESAREAN SECTION      Medical History: Past Medical History:  Diagnosis Date  . Allergy   . Hypertension     Family History: Family History  Problem Relation Age of Onset  . Heart disease Mother   . Hypertension Father     Social History   Socioeconomic History  . Marital status: Married    Spouse name: Not on file  . Number of children: Not on file  . Years of education: Not on file  . Highest education level: Not on file  Occupational History  . Not on file  Social Needs  . Financial resource strain: Not on file  . Food insecurity:    Worry: Not on file     Inability: Not on file  . Transportation needs:    Medical: Not on file    Non-medical: Not on file  Tobacco Use  . Smoking status: Never Smoker  . Smokeless tobacco: Never Used  Substance and Sexual Activity  . Alcohol use: No    Frequency: Never  . Drug use: No  . Sexual activity: Not on file  Lifestyle  . Physical activity:    Days per week: Not on file    Minutes per session: Not on file  . Stress: Not on file  Relationships  . Social connections:    Talks on phone: Not on file    Gets together: Not on file    Attends religious service: Not on file    Active member of club or organization: Not on file    Attends meetings of clubs or organizations: Not on file    Relationship status: Not on file  . Intimate partner violence:    Fear of current or ex partner: Not on file    Emotionally abused: Not on file    Physically abused: Not on file    Forced sexual activity: Not on file  Other Topics Concern  . Not on file  Social History Narrative  . Not on file      Review of Systems  Constitutional: Positive  for fatigue. Negative for activity change, chills and unexpected weight change.  HENT: Negative for congestion, postnasal drip, rhinorrhea, sneezing and sore throat.   Eyes: Negative.  Negative for redness.  Respiratory: Positive for cough. Negative for chest tightness and shortness of breath.   Cardiovascular: Positive for palpitations. Negative for chest pain.  Gastrointestinal: Negative for abdominal pain, constipation, diarrhea, nausea and vomiting.  Endocrine: Negative for cold intolerance, heat intolerance, polydipsia, polyphagia and polyuria.  Genitourinary: Negative for dysuria, flank pain and frequency.  Musculoskeletal: Positive for back pain. Negative for arthralgias, joint swelling and neck pain.  Skin: Negative for rash.  Allergic/Immunologic: Negative for environmental allergies.  Neurological: Positive for headaches. Negative for tremors and numbness.   Hematological: Negative for adenopathy. Does not bruise/bleed easily.  Psychiatric/Behavioral: Negative for behavioral problems (Depression), sleep disturbance and suicidal ideas. The patient is nervous/anxious.     Today's Vitals   07/15/17 1454  BP: (!) 142/80  Pulse: 76  Resp: 16  SpO2: 96%  Weight: 227 lb 6.4 oz (103.1 kg)  Height: 5' 2"  (1.575 m)     Physical Exam  Constitutional: She is oriented to person, place, and time. She appears well-developed and well-nourished. No distress.  HENT:  Head: Normocephalic and atraumatic.  Mouth/Throat: Oropharynx is clear and moist. No oropharyngeal exudate.  Eyes: Pupils are equal, round, and reactive to light. EOM are normal.  Neck: Normal range of motion. Neck supple. No JVD present. No tracheal deviation present. No thyromegaly present.  Cardiovascular: Normal rate, regular rhythm and normal heart sounds. Exam reveals no gallop and no friction rub.  No murmur heard. ECg today showing no cute abnormalities or ischemia. There is low-voltage in precordial leads.   Pulmonary/Chest: Effort normal and breath sounds normal. No respiratory distress. She has no wheezes. She has no rales. She exhibits no tenderness.  Dry and non-productive cough is noted.   Abdominal: Soft. Bowel sounds are normal. There is no tenderness.  Musculoskeletal: Normal range of motion.  Lymphadenopathy:    She has no cervical adenopathy.  Neurological: She is alert and oriented to person, place, and time. No cranial nerve deficit.  Skin: Skin is warm and dry. She is not diaphoretic.  Psychiatric: She has a normal mood and affect. Her behavior is normal. Judgment and thought content normal.  Nursing note and vitals reviewed.   Assessment/Plan: .1. Cough Continue antibiotic treatment as prescribed. Add tessalon perls three times daily as needed for cough.  - benzonatate (TESSALON) 200 MG capsule; Take 1 capsule (200 mg total) by mouth 2 (two) times daily as  needed for cough.  Dispense: 40 capsule; Refill: 0  2. Essential hypertension Stable. Continue atenolol as prescribed.   3. Palpitations Reviewed echocardiogram. Trace tricuspid regurgitation noted, but otherwise normal exam. Will continue to monitor.   4. Chronic fatigue Reviewed labs with patient. No obvious reason for fatigue. Encouraged her to try b complex vitamin. Monitor closely   General Counseling: Gwynn verbalizes understanding of the findings of todays visit and agrees with plan of treatment. I have discussed any further diagnostic evaluation that may be needed or ordered today. We also reviewed her medications today. she has been encouraged to call the office with any questions or concerns that should arise related to todays visit.   This patient was seen by Leretha Pol, FNP- C in Collaboration with Dr Lavera Guise as a part of collaborative care agreement  Meds ordered this encounter  Medications  . benzonatate (TESSALON) 200 MG capsule  Sig: Take 1 capsule (200 mg total) by mouth 2 (two) times daily as needed for cough.    Dispense:  40 capsule    Refill:  0    Order Specific Question:   Supervising Provider    Answer:   Lavera Guise [[id number]]    Time spent: 42 Minutes     Dr Lavera Guise Internal medicine

## 2017-10-05 ENCOUNTER — Other Ambulatory Visit: Payer: Self-pay

## 2017-10-05 DIAGNOSIS — I10 Essential (primary) hypertension: Secondary | ICD-10-CM

## 2017-10-05 MED ORDER — ATENOLOL 25 MG PO TABS: 25.0000 mg | ORAL_TABLET | Freq: Every day | ORAL | 3 refills | Status: DC

## 2018-01-24 ENCOUNTER — Encounter: Payer: Self-pay | Admitting: Nurse Practitioner

## 2018-01-24 ENCOUNTER — Ambulatory Visit: Payer: PRIVATE HEALTH INSURANCE | Admitting: Nurse Practitioner

## 2018-01-24 VITALS — BP 135/85 | HR 69 | Resp 16 | Ht 62.0 in | Wt 227.6 lb

## 2018-01-24 DIAGNOSIS — Z1239 Encounter for other screening for malignant neoplasm of breast: Secondary | ICD-10-CM

## 2018-01-24 DIAGNOSIS — L209 Atopic dermatitis, unspecified: Secondary | ICD-10-CM

## 2018-01-24 DIAGNOSIS — I10 Essential (primary) hypertension: Secondary | ICD-10-CM | POA: Diagnosis not present

## 2018-01-24 MED ORDER — CLOTRIMAZOLE-BETAMETHASONE 1-0.05 % EX CREA: 1.0000 "application " | TOPICAL_CREAM | Freq: Two times a day (BID) | CUTANEOUS | 1 refills | Status: DC

## 2018-01-24 MED ORDER — ATENOLOL 25 MG PO TABS: 25.0000 mg | ORAL_TABLET | Freq: Every day | ORAL | 5 refills | Status: DC

## 2018-01-24 NOTE — Progress Notes (Signed)
[facility] [place], [place] [address]  Internal MEDICINE  Office Visit Note  Patient Name: Janet Henry  [id number]  [id number]  Date of Service: 01/24/2018  Chief Complaint  Patient presents with  . Medical Management of Chronic Issues    6 month follow up  . Rash    pt has rash on right shoulder area, has been there for about three days, pt states that this is the only are awith the rash and the rash is itchy  . Hypertension    The patient has red, itchy rash on the right side of her neck. Has been present for three days. Very red and irritated. If it gets hot, the rash gets more itchy. No new detergents, soaps, or lotions are being used. Rash is only present in one area.  She does have history of elevated blood pressure and migraine headaches. She takes atenolol. Blood pressure is well controlled and headaches are infrequent and manageable.   Current Medication: Outpatient Encounter Medications as of 01/24/2018  Medication Sig  . atenolol (TENORMIN) 25 MG tablet Take 1 tablet (25 mg total) by mouth at bedtime.  . benzonatate (TESSALON) 200 MG capsule Take 1 capsule (200 mg total) by mouth 2 (two) times daily as needed for cough.  . fluticasone (FLONASE) 50 MCG/ACT nasal spray Place into the nose.  . [DISCONTINUED] atenolol (TENORMIN) 25 MG tablet Take 1 tablet (25 mg total) by mouth at bedtime.  . clotrimazole-betamethasone (LOTRISONE) cream Apply 1 application topically 2 (two) times daily.   No facility-administered encounter medications on file as of 01/24/2018.     Surgical History: Past Surgical History:  Procedure Laterality Date  . CESAREAN SECTION      Medical History: Past Medical History:  Diagnosis Date  . Allergy   . Hypertension     Family History: Family History  Problem Relation Age of Onset  . Heart disease Mother   . Hypertension Father     Social History   Socioeconomic History  . Marital status: Married   Spouse name: Not on file  . Number of children: Not on file  . Years of education: Not on file  . Highest education level: Not on file  Occupational History  . Not on file  Social Needs  . Financial resource strain: Not on file  . Food insecurity:    Worry: Not on file    Inability: Not on file  . Transportation needs:    Medical: Not on file    Non-medical: Not on file  Tobacco Use  . Smoking status: Never Smoker  . Smokeless tobacco: Never Used  Substance and Sexual Activity  . Alcohol use: No    Frequency: Never  . Drug use: No  . Sexual activity: Not on file  Lifestyle  . Physical activity:    Days per week: Not on file    Minutes per session: Not on file  . Stress: Not on file  Relationships  . Social connections:    Talks on phone: Not on file    Gets together: Not on file    Attends religious service: Not on file    Active member of club or organization: Not on file    Attends meetings of clubs or organizations: Not on file    Relationship status: Not on file  . Intimate partner violence:    Fear of current or ex partner: Not on file    Emotionally abused: Not on file  Physically abused: Not on file    Forced sexual activity: Not on file  Other Topics Concern  . Not on file  Social History Narrative  . Not on file      Review of Systems  Constitutional: Negative for activity change, chills, fatigue and unexpected weight change.  HENT: Negative for congestion, postnasal drip, rhinorrhea, sneezing and sore throat.   Eyes: Negative for redness.  Respiratory: Negative for cough, chest tightness and shortness of breath.   Cardiovascular: Negative for chest pain and palpitations.  Gastrointestinal: Negative for abdominal pain, constipation, diarrhea, nausea and vomiting.  Endocrine: Negative for cold intolerance, heat intolerance, polydipsia, polyphagia and polyuria.  Musculoskeletal: Negative for arthralgias, back pain, joint swelling and neck pain.  Skin:  Positive for rash.       Right side of neck which s very itchy.   Neurological: Negative for headaches.  Hematological: Negative for adenopathy. Does not bruise/bleed easily.  Psychiatric/Behavioral: Negative for behavioral problems (Depression), sleep disturbance and suicidal ideas. The patient is not nervous/anxious.     Today's Vitals   01/24/18 1019  BP: 135/85  Pulse: 69  Resp: 16  SpO2: 93%  Weight: 227 lb 9.6 oz (103.2 kg)  Height: 5' 2"  (1.575 m)    Physical Exam  Constitutional: She is oriented to person, place, and time. She appears well-developed and well-nourished. No distress.  HENT:  Head: Normocephalic and atraumatic.  Mouth/Throat: No oropharyngeal exudate.  Eyes: Pupils are equal, round, and reactive to light. EOM are normal.  Neck: Normal range of motion. Neck supple.  Cardiovascular: Normal rate, regular rhythm and normal heart sounds. Exam reveals no gallop and no friction rub.  No murmur heard. Pulmonary/Chest: Effort normal and breath sounds normal. No respiratory distress. She has no wheezes. She has no rales. She exhibits no tenderness.  Musculoskeletal: Normal range of motion.  Neurological: She is alert and oriented to person, place, and time. No cranial nerve deficit.  Skin: Skin is warm and dry. Rash noted. She is not diaphoretic.  There is fine, red, raised rash at the base of right side of the neck. Appears irritated. No evidence of infection. Skin intact with no drainage.   Psychiatric: She has a normal mood and affect. Her behavior is normal. Judgment and thought content normal.  Nursing note and vitals reviewed.  Assessment/Plan: 1. Essential hypertension Well controlled. Continue atenolol as prescribed. Refills provided today - atenolol (TENORMIN) 25 MG tablet; Take 1 tablet (25 mg total) by mouth at bedtime.  Dispense: 30 tablet; Refill: 5  2. Atopic dermatitis, unspecified type Samples Eucrysa given. Advised her to use a small amount to the  affected area twice daily. If no improvement in next few days, she may get lotrisone cream, also applied twice daily.  Patient understood the instructions.  - clotrimazole-betamethasone (LOTRISONE) cream; Apply 1 application topically 2 (two) times daily.  Dispense: 45 g; Refill: 1  3. Screening for breast cancer - MM DIGITAL SCREENING BILATERAL; Future  General Counseling: yancy hascall understanding of the findings of todays visit and agrees with plan of treatment. I have discussed any further diagnostic evaluation that may be needed or ordered today. We also reviewed her medications today. she has been encouraged to call the office with any questions or concerns that should arise related to todays visit.  This patient was seen by Leretha Pol FNP Collaboration with Dr Lavera Guise as a part of collaborative care agreement  Orders Placed This Encounter  Procedures  . MM  DIGITAL SCREENING BILATERAL    Meds ordered this encounter  Medications  . atenolol (TENORMIN) 25 MG tablet    Sig: Take 1 tablet (25 mg total) by mouth at bedtime.    Dispense:  30 tablet    Refill:  5    Order Specific Question:   Supervising Provider    Answer:   Lavera Guise [[id number]]  . clotrimazole-betamethasone (LOTRISONE) cream    Sig: Apply 1 application topically 2 (two) times daily.    Dispense:  45 g    Refill:  1    Order Specific Question:   Supervising Provider    Answer:   Lavera Guise [[id number]]    Time spent: 61 Minutes      Dr Lavera Guise Internal medicine

## 2018-02-08 ENCOUNTER — Other Ambulatory Visit: Payer: Self-pay

## 2018-02-08 DIAGNOSIS — I10 Essential (primary) hypertension: Secondary | ICD-10-CM

## 2018-02-08 MED ORDER — ATENOLOL 25 MG PO TABS: 25.0000 mg | ORAL_TABLET | Freq: Every day | ORAL | 5 refills | Status: DC

## 2018-07-27 ENCOUNTER — Other Ambulatory Visit: Payer: Self-pay | Admitting: Nurse Practitioner

## 2018-08-08 ENCOUNTER — Other Ambulatory Visit: Payer: Self-pay

## 2018-08-08 ENCOUNTER — Telehealth: Payer: Self-pay

## 2018-08-08 NOTE — Telephone Encounter (Signed)
CALLED AND LMOM FOR PATIENT TO SCHEDULE AN APPT FOR MED REFILL. CANNOT REFILL ATENOLOL WITHOUT PATIENT SCHEDULING AN APPT FIRST.

## 2018-08-17 ENCOUNTER — Ambulatory Visit: Payer: PRIVATE HEALTH INSURANCE | Admitting: Nurse Practitioner

## 2018-08-17 ENCOUNTER — Other Ambulatory Visit: Payer: Self-pay

## 2019-05-03 ENCOUNTER — Other Ambulatory Visit: Payer: Self-pay

## 2019-05-04 ENCOUNTER — Other Ambulatory Visit: Payer: Self-pay

## 2019-05-04 DIAGNOSIS — I10 Essential (primary) hypertension: Secondary | ICD-10-CM

## 2019-05-07 ENCOUNTER — Ambulatory Visit (INDEPENDENT_AMBULATORY_CARE_PROVIDER_SITE_OTHER): Payer: PRIVATE HEALTH INSURANCE | Admitting: Nurse Practitioner

## 2019-05-07 ENCOUNTER — Encounter: Payer: Self-pay | Admitting: Nurse Practitioner

## 2019-05-07 DIAGNOSIS — I10 Essential (primary) hypertension: Secondary | ICD-10-CM | POA: Diagnosis not present

## 2019-05-07 MED ORDER — ATENOLOL 25 MG PO TABS: 25.0000 mg | ORAL_TABLET | Freq: Every day | ORAL | 3 refills | Status: DC

## 2019-05-07 NOTE — Progress Notes (Signed)
[facility] [place], [place] [address]  Internal MEDICINE  Telephone Visit  Patient Name: Janet Henry  [id number]  [id number]  Date of Service: 05/09/2019  I connected with the patient at 4:45pm by webcam and verified the patients identity using two identifiers.   I discussed the limitations, risks, security and privacy concerns of performing an evaluation and management service by webcam and the availability of in person appointments. I also discussed with the patient that there may be a patient responsible charge related to the service.  The patient expressed understanding and agrees to proceed.    Chief Complaint  Patient presents with  . Telephone Assessment  . Telephone Screen  . Hypertension    The patient has been contacted via webcam for follow up visit due to concerns for spread of novel coronavirus. She states that she is feeling well. Has no concerns or complaints. She does need to have refills of her routine medication. Headaches have been well managed. She is seeing a  GYN provider for her well woman care.       Current Medication: Outpatient Encounter Medications as of 05/07/2019  Medication Sig  . atenolol (TENORMIN) 25 MG tablet Take 1 tablet (25 mg total) by mouth at bedtime.  . Norethindrone (NORLYDA PO) Take by mouth.  . [DISCONTINUED] atenolol (TENORMIN) 25 MG tablet Take 1 tablet (25 mg total) by mouth at bedtime.  . [DISCONTINUED] benzonatate (TESSALON) 200 MG capsule Take 1 capsule (200 mg total) by mouth 2 (two) times daily as needed for cough. (Patient not taking: Reported on 05/07/2019)  . [DISCONTINUED] clotrimazole-betamethasone (LOTRISONE) cream Apply 1 application topically 2 (two) times daily. (Patient not taking: Reported on 05/07/2019)  . [DISCONTINUED] fluticasone (FLONASE) 50 MCG/ACT nasal spray Place into the nose.   No facility-administered encounter medications on file as of 05/07/2019.    Surgical History: Past  Surgical History:  Procedure Laterality Date  . CESAREAN SECTION      Medical History: Past Medical History:  Diagnosis Date  . Allergy   . Hypertension     Family History: Family History  Problem Relation Age of Onset  . Heart disease Mother   . Hypertension Father     Social History   Socioeconomic History  . Marital status: Married    Spouse name: Not on file  . Number of children: Not on file  . Years of education: Not on file  . Highest education level: Not on file  Occupational History  . Not on file  Tobacco Use  . Smoking status: Never Smoker  . Smokeless tobacco: Never Used  Substance and Sexual Activity  . Alcohol use: No  . Drug use: No  . Sexual activity: Not on file  Other Topics Concern  . Not on file  Social History Narrative  . Not on file   Social Determinants of Health   Financial Resource Strain:   . Difficulty of Paying Living Expenses: Not on file  Food Insecurity:   . Worried About Charity fundraiser in the Last Year: Not on file  . Ran Out of Food in the Last Year: Not on file  Transportation Needs:   . Lack of Transportation (Medical): Not on file  . Lack of Transportation (Non-Medical): Not on file  Physical Activity:   . Days of Exercise per Week: Not on file  . Minutes of Exercise per Session: Not on file  Stress:   . Feeling of Stress : Not on  file  Social Connections:   . Frequency of Communication with Friends and Family: Not on file  . Frequency of Social Gatherings with Friends and Family: Not on file  . Attends Religious Services: Not on file  . Active Member of Clubs or Organizations: Not on file  . Attends Archivist Meetings: Not on file  . Marital Status: Not on file  Intimate Partner Violence:   . Fear of Current or Ex-Partner: Not on file  . Emotionally Abused: Not on file  . Physically Abused: Not on file  . Sexually Abused: Not on file      Review of Systems  Constitutional: Negative for  activity change, chills, fatigue and unexpected weight change.  HENT: Negative for congestion, postnasal drip, rhinorrhea, sneezing and sore throat.   Respiratory: Negative for cough, chest tightness and shortness of breath.   Cardiovascular: Negative for chest pain and palpitations.  Gastrointestinal: Negative for abdominal pain, constipation, diarrhea, nausea and vomiting.  Endocrine: Negative for cold intolerance, heat intolerance, polydipsia and polyuria.  Musculoskeletal: Negative for arthralgias, back pain, joint swelling and neck pain.  Skin: Negative for rash.  Allergic/Immunologic: Negative for environmental allergies.  Neurological: Positive for headaches. Negative for tremors and numbness.       Headaches have been well managed.   Hematological: Negative for adenopathy. Does not bruise/bleed easily.  Psychiatric/Behavioral: Negative for behavioral problems (Depression), sleep disturbance and suicidal ideas. The patient is nervous/anxious.     Today's Vitals   05/07/19 1631  BP: 133/79  Temp: (!) 97.1 F (36.2 C)  Weight: 230 lb (104.3 kg)  Height: 5' 2"  (1.575 m)   Body mass index is 42.07 kg/m.  Observation/Objective:   The patient is alert and oriented. She is pleasant and answers all questions appropriately. Breathing is non-labored. She is in no acute distress at this time.    Assessment/Plan: 1. Essential hypertension Stable. Continue atenolol as prescribed. Refills provided today.  - atenolol (TENORMIN) 25 MG tablet; Take 1 tablet (25 mg total) by mouth at bedtime.  Dispense: 90 tablet; Refill: 3  General Counseling: Janet Henry verbalizes understanding of the findings of today's phone visit and agrees with plan of treatment. I have discussed any further diagnostic evaluation that may be needed or ordered today. We also reviewed her medications today. she has been encouraged to call the office with any questions or concerns that should arise related to todays  visit.  Hypertension Counseling:   The following hypertensive lifestyle modification were recommended and discussed:  1. Limiting alcohol intake to less than 1 oz/day of ethanol:(24 oz of beer or 8 oz of wine or 2 oz of 100-proof whiskey). 2. Take baby ASA 81 mg daily. 3. Importance of regular aerobic exercise and losing weight. 4. Reduce dietary saturated fat and cholesterol intake for overall cardiovascular health. 5. Maintaining adequate dietary potassium, calcium, and magnesium intake. 6. Regular monitoring of the blood pressure. 7. Reduce sodium intake to less than 100 mmol/day (less than 2.3 gm of sodium or less than 6 gm of sodium choride)   This patient was seen by [place] with Dr Lavera Guise as a part of collaborative care agreement  Meds ordered this encounter  Medications  . atenolol (TENORMIN) 25 MG tablet    Sig: Take 1 tablet (25 mg total) by mouth at bedtime.    Dispense:  90 tablet    Refill:  3    Order Specific Question:   Supervising Provider  AnswerLavera Guise [[id number]]    Time spent: 59 Minutes    Dr Lavera Guise Internal medicine

## 2019-05-31 ENCOUNTER — Ambulatory Visit: Payer: PRIVATE HEALTH INSURANCE | Admitting: Adult Health

## 2019-05-31 ENCOUNTER — Other Ambulatory Visit: Payer: Self-pay

## 2019-05-31 VITALS — Temp 97.8°F | Ht 62.0 in | Wt 220.0 lb

## 2019-05-31 DIAGNOSIS — U071 COVID-19: Secondary | ICD-10-CM

## 2019-05-31 DIAGNOSIS — R059 Cough, unspecified: Secondary | ICD-10-CM

## 2019-05-31 DIAGNOSIS — I10 Essential (primary) hypertension: Secondary | ICD-10-CM

## 2019-05-31 DIAGNOSIS — R05 Cough: Secondary | ICD-10-CM | POA: Diagnosis not present

## 2019-05-31 MED ORDER — AZITHROMYCIN 250 MG PO TABS: ORAL_TABLET | ORAL | 0 refills | Status: DC

## 2019-05-31 MED ORDER — ALBUTEROL SULFATE HFA 108 (90 BASE) MCG/ACT IN AERS: 2.0000 | INHALATION_SPRAY | Freq: Four times a day (QID) | RESPIRATORY_TRACT | 0 refills | Status: DC | PRN

## 2019-05-31 MED ORDER — PREDNISONE 10 MG PO TABS: ORAL_TABLET | ORAL | 0 refills | Status: DC

## 2019-05-31 MED ORDER — BENZONATATE 200 MG PO CAPS: 200.0000 mg | ORAL_CAPSULE | Freq: Two times a day (BID) | ORAL | 0 refills | Status: DC | PRN

## 2019-05-31 NOTE — Telephone Encounter (Signed)
As per adam send albuterol inhaler to [facility]

## 2019-05-31 NOTE — Progress Notes (Signed)
[facility] [place], [place] [address]  Internal MEDICINE  Telephone Visit  Patient Name: Janet Henry  [id number]  [id number]  Date of Service: 05/31/2019  I connected with the patient at 1224 by video and verified the patients identity using two identifiers.   I discussed the limitations, risks, security and privacy concerns of performing an evaluation and management service by telephone and the availability of in person appointments. I also discussed with the patient that there may be a patient responsible charge related to the service.  The patient expressed understanding and agrees to proceed.    Chief Complaint  Patient presents with  . Telephone Assessment    tested pos tuesday   . Telephone Screen  . Sinusitis    burning in nose, pressure in head  . Fatigue  . Cough    feels like she may have a build up of congestion in chest   . Chills    has gotten better since yesterday  . Generalized Body Aches    hsa gotten better since yesterday     HPI  PT seen via video. She reports feeling bad 4 days ago.  She went to get tested and is positive for covid.  She reports chills, headache, sinus pain/pressure.  She has been coughing, and has a large amount of congestion. Denies any fever that she is aware of. She is tremendously fatigued at this time.    Current Medication: Outpatient Encounter Medications as of 05/31/2019  Medication Sig  . atenolol (TENORMIN) 25 MG tablet Take 1 tablet (25 mg total) by mouth at bedtime.  . Norethindrone (NORLYDA PO) Take by mouth.  Marland Kitchen azithromycin (ZITHROMAX) 250 MG tablet Take as directed.  . benzonatate (TESSALON) 200 MG capsule Take 1 capsule (200 mg total) by mouth 2 (two) times daily as needed for cough.  . predniSONE (DELTASONE) 10 MG tablet Use per dose pack   No facility-administered encounter medications on file as of 05/31/2019.    Surgical History: Past Surgical History:  Procedure Laterality Date  .  CESAREAN SECTION      Medical History: Past Medical History:  Diagnosis Date  . Allergy   . Hypertension     Family History: Family History  Problem Relation Age of Onset  . Heart disease Mother   . Hypertension Father     Social History   Socioeconomic History  . Marital status: Married    Spouse name: Not on file  . Number of children: Not on file  . Years of education: Not on file  . Highest education level: Not on file  Occupational History  . Not on file  Tobacco Use  . Smoking status: Never Smoker  . Smokeless tobacco: Never Used  Substance and Sexual Activity  . Alcohol use: No  . Drug use: No  . Sexual activity: Not on file  Other Topics Concern  . Not on file  Social History Narrative  . Not on file   Social Determinants of Health   Financial Resource Strain:   . Difficulty of Paying Living Expenses:   Food Insecurity:   . Worried About Charity fundraiser in the Last Year:   . Arboriculturist in the Last Year:   Transportation Needs:   . Film/video editor (Medical):   Marland Kitchen Lack of Transportation (Non-Medical):   Physical Activity:   . Days of Exercise per Week:   . Minutes of Exercise per Session:   Stress:   .  Feeling of Stress :   Social Connections:   . Frequency of Communication with Friends and Family:   . Frequency of Social Gatherings with Friends and Family:   . Attends Religious Services:   . Active Member of Clubs or Organizations:   . Attends Archivist Meetings:   Marland Kitchen Marital Status:   Intimate Partner Violence:   . Fear of Current or Ex-Partner:   . Emotionally Abused:   Marland Kitchen Physically Abused:   . Sexually Abused:       Review of Systems  Constitutional: Positive for chills and fatigue. Negative for unexpected weight change.  HENT: Positive for rhinorrhea, sinus pressure and sinus pain. Negative for congestion, sneezing and sore throat.   Eyes: Negative for photophobia, pain and redness.  Respiratory: Positive  for cough and chest tightness. Negative for shortness of breath.   Cardiovascular: Negative for chest pain and palpitations.  Gastrointestinal: Negative for abdominal pain, constipation, diarrhea, nausea and vomiting.  Endocrine: Negative.   Genitourinary: Negative for dysuria and frequency.  Musculoskeletal: Negative for arthralgias, back pain, joint swelling and neck pain.  Skin: Negative for rash.  Allergic/Immunologic: Negative.   Neurological: Negative for tremors and numbness.  Hematological: Negative for adenopathy. Does not bruise/bleed easily.  Psychiatric/Behavioral: Negative for behavioral problems and sleep disturbance. The patient is not nervous/anxious.     Vital Signs: Temp 97.8 F (36.6 C)   Ht 5' 2"  (1.575 m)   Wt 220 lb (99.8 kg)   BMI 40.24 kg/m    Observation/Objective: Ill appearing, NAD noted.     Assessment/Plan: 1. COVID-19 Advised patient to take entire course of antibiotics as prescribed with food. Pt should return to clinic in 7-10 days if symptoms fail to improve or new symptoms develop. Discussed symptom management and when to seek emergency treatment.  - azithromycin (ZITHROMAX) 250 MG tablet; Take as directed.  Dispense: 6 tablet; Refill: 0 - predniSONE (DELTASONE) 10 MG tablet; Use per dose pack  Dispense: 21 tablet; Refill: 0  2. Cough Use tessalon as discussed. Follow up with clinic if symptoms persist or worsen.   - benzonatate (TESSALON) 200 MG capsule; Take 1 capsule (200 mg total) by mouth 2 (two) times daily as needed for cough.  Dispense: 20 capsule; Refill: 0  3. Essential hypertension Stable, continue present mgmt.   General Counseling: Janet Henry understanding of the findings of today's phone visit and agrees with plan of treatment. I have discussed any further diagnostic evaluation that may be needed or ordered today. We also reviewed her medications today. she has been encouraged to call the office with any questions or  concerns that should arise related to todays visit.    No orders of the defined types were placed in this encounter.   Meds ordered this encounter  Medications  . azithromycin (ZITHROMAX) 250 MG tablet    Sig: Take as directed.    Dispense:  6 tablet    Refill:  0  . predniSONE (DELTASONE) 10 MG tablet    Sig: Use per dose pack    Dispense:  21 tablet    Refill:  0  . benzonatate (TESSALON) 200 MG capsule    Sig: Take 1 capsule (200 mg total) by mouth 2 (two) times daily as needed for cough.    Dispense:  20 capsule    Refill:  0    Time spent: Sigel AGNP-C Internal medicine

## 2019-06-01 ENCOUNTER — Ambulatory Visit: Payer: PRIVATE HEALTH INSURANCE | Admitting: Nurse Practitioner

## 2019-06-05 ENCOUNTER — Telehealth: Payer: Self-pay | Admitting: Adult Health

## 2019-06-05 NOTE — Telephone Encounter (Signed)
Spoke with patient and she was seen by [facility] provider this morning and was able to get an infusion and I advised pt to call us if that doesn't help and we can add RX order to get her a nebulizer and meds. Beth

## 2019-11-02 ENCOUNTER — Telehealth: Payer: Self-pay

## 2019-11-02 NOTE — Telephone Encounter (Signed)
Lmom to confirm and screen for 11-06-19 ov.

## 2019-11-06 ENCOUNTER — Other Ambulatory Visit: Payer: PRIVATE HEALTH INSURANCE | Admitting: Nurse Practitioner

## 2019-11-06 ENCOUNTER — Telehealth: Payer: Self-pay

## 2019-11-06 NOTE — Telephone Encounter (Signed)
Called pt and spoke with her and once she gets back into town she will call to reschedule missed appointment from today. Janet Henry

## 2019-12-20 ENCOUNTER — Ambulatory Visit (INDEPENDENT_AMBULATORY_CARE_PROVIDER_SITE_OTHER): Payer: PRIVATE HEALTH INSURANCE | Admitting: Nurse Practitioner

## 2019-12-20 ENCOUNTER — Encounter: Payer: Self-pay | Admitting: Nurse Practitioner

## 2019-12-20 VITALS — BP 126/77 | HR 65 | Temp 97.5°F | Resp 16 | Ht 62.0 in | Wt 198.0 lb

## 2019-12-20 DIAGNOSIS — I10 Essential (primary) hypertension: Secondary | ICD-10-CM | POA: Diagnosis not present

## 2019-12-20 DIAGNOSIS — R5382 Chronic fatigue, unspecified: Secondary | ICD-10-CM

## 2019-12-20 MED ORDER — ATENOLOL 25 MG PO TABS: 25.0000 mg | ORAL_TABLET | Freq: Every day | ORAL | 3 refills | Status: DC

## 2019-12-20 NOTE — Progress Notes (Signed)
[facility] [place], [place] [address]  Internal MEDICINE  Office Visit Note  Patient Name: Janet Henry  [id number]  [id number]  Date of Service: 01/06/2020  Chief Complaint  Patient presents with   Follow-up   Hypertension   Quality Metric Gaps    flu, tetnaus,covid,Hep C   other    controlled substance form given to PT   Medication Refill    The patient is here for follow up visit. Headaches and blood pressure continue to be well managed with current medication. She has no new concerns or complaints. Does need a refill for her atenolol. She is due to have routine, fasting labs.       Current Medication: Outpatient Encounter Medications as of 12/20/2019  Medication Sig   atenolol (TENORMIN) 25 MG tablet Take 1 tablet (25 mg total) by mouth at bedtime.   Norethindrone (NORLYDA PO) Take by mouth.   [DISCONTINUED] albuterol (VENTOLIN HFA) 108 (90 Base) MCG/ACT inhaler Inhale 2 puffs into the lungs every 6 (six) hours as needed for wheezing or shortness of breath.   [DISCONTINUED] atenolol (TENORMIN) 25 MG tablet Take 1 tablet (25 mg total) by mouth at bedtime.   [DISCONTINUED] azithromycin (ZITHROMAX) 250 MG tablet Take as directed.   [DISCONTINUED] benzonatate (TESSALON) 200 MG capsule Take 1 capsule (200 mg total) by mouth 2 (two) times daily as needed for cough.   [DISCONTINUED] predniSONE (DELTASONE) 10 MG tablet Use per dose pack   No facility-administered encounter medications on file as of 12/20/2019.    Surgical History: Past Surgical History:  Procedure Laterality Date   CESAREAN SECTION      Medical History: Past Medical History:  Diagnosis Date   Allergy    Hypertension     Family History: Family History  Problem Relation Age of Onset   Heart disease Mother    Hypertension Father     Social History   Socioeconomic History   Marital status: Married    Spouse name: Not on file   Number of children:  Not on file   Years of education: Not on file   Highest education level: Not on file  Occupational History   Not on file  Tobacco Use   Smoking status: Never Smoker   Smokeless tobacco: Never Used  Substance and Sexual Activity   Alcohol use: No   Drug use: No   Sexual activity: Not on file  Other Topics Concern   Not on file  Social History Narrative   Not on file   Social Determinants of Health   Financial Resource Strain:    Difficulty of Paying Living Expenses: Not on file  Food Insecurity:    Worried About [place] in the Last Year: Not on file   Ran Out of Food in the Last Year: Not on file  Transportation Needs:    Lack of Transportation (Medical): Not on file   Lack of Transportation (Non-Medical): Not on file  Physical Activity:    Days of Exercise per Week: Not on file   Minutes of Exercise per Session: Not on file  Stress:    Feeling of Stress : Not on file  Social Connections:    Frequency of Communication with Friends and Family: Not on file   Frequency of Social Gatherings with Friends and Family: Not on file   Attends Religious Services: Not on file   Active Member of Clubs or Organizations: Not on file   Attends Club or  Organization Meetings: Not on file   Marital Status: Not on file  Intimate Partner Violence:    Fear of Current or Ex-Partner: Not on file   Emotionally Abused: Not on file   Physically Abused: Not on file   Sexually Abused: Not on file      Review of Systems  Constitutional: Negative for activity change, chills, fatigue and unexpected weight change.  HENT: Negative for congestion, postnasal drip, rhinorrhea, sneezing and sore throat.   Respiratory: Negative for cough, chest tightness and shortness of breath.   Cardiovascular: Negative for chest pain and palpitations.  Gastrointestinal: Negative for abdominal pain, constipation, diarrhea, nausea and vomiting.  Endocrine: Negative for cold  intolerance, heat intolerance, polydipsia and polyuria.  Musculoskeletal: Negative for arthralgias, back pain, joint swelling and neck pain.  Skin: Negative for rash.  Allergic/Immunologic: Negative for environmental allergies.  Neurological: Positive for headaches. Negative for tremors and numbness.       Headaches have been well managed.   Hematological: Negative for adenopathy. Does not bruise/bleed easily.  Psychiatric/Behavioral: Negative for behavioral problems (Depression), sleep disturbance and suicidal ideas. The patient is nervous/anxious.     Today's Vitals   12/20/19 0940  BP: 126/77  Pulse: 65  Resp: 16  Temp: (!) 97.5 F (36.4 C)  SpO2: 98%  Weight: 198 lb (89.8 kg)  Height: 5' 2"  (1.575 m)   Body mass index is 36.21 kg/m.  Physical Exam Vitals and nursing note reviewed.  Constitutional:      General: She is not in acute distress.    Appearance: Normal appearance. She is well-developed. She is obese. She is not diaphoretic.  HENT:     Head: Normocephalic and atraumatic.     Nose: Nose normal.     Mouth/Throat:     Pharynx: No oropharyngeal exudate.  Eyes:     Pupils: Pupils are equal, round, and reactive to light.  Neck:     Thyroid: No thyromegaly.     Vascular: No carotid bruit or JVD.     Trachea: No tracheal deviation.  Cardiovascular:     Rate and Rhythm: Normal rate and regular rhythm.     Heart sounds: Normal heart sounds. No murmur heard.  No friction rub. No gallop.   Pulmonary:     Effort: Pulmonary effort is normal. No respiratory distress.     Breath sounds: Normal breath sounds. No wheezing or rales.  Chest:     Chest wall: No tenderness.  Abdominal:     Palpations: Abdomen is soft.  Musculoskeletal:        General: Normal range of motion.     Cervical back: Normal range of motion and neck supple.  Lymphadenopathy:     Cervical: No cervical adenopathy.  Skin:    General: Skin is warm and dry.  Neurological:     General: No focal  deficit present.     Mental Status: She is alert and oriented to person, place, and time.     Cranial Nerves: No cranial nerve deficit.  Psychiatric:        Mood and Affect: Mood normal.        Behavior: Behavior normal.        Thought Content: Thought content normal.        Judgment: Judgment normal.    Assessment/Plan: 1. Essential hypertension Stable. Continue atenolol 41m daily. Refills provided today  - atenolol (TENORMIN) 25 MG tablet; Take 1 tablet (25 mg total) by mouth at bedtime.  Dispense: 90  tablet; Refill: 3  2. Chronic fatigue Check labs including anemia and thyroid panels for further evaluation.  General Counseling: pa tennant understanding of the findings of todays visit and agrees with plan of treatment. I have discussed any further diagnostic evaluation that may be needed or ordered today. We also reviewed her medications today. she has been encouraged to call the office with any questions or concerns that should arise related to todays visit.   This patient was seen by Leretha Pol FNP Collaboration with Dr Lavera Guise as a part of collaborative care agreement  Meds ordered this encounter  Medications   atenolol (TENORMIN) 25 MG tablet    Sig: Take 1 tablet (25 mg total) by mouth at bedtime.    Dispense:  90 tablet    Refill:  3    Order Specific Question:   Supervising Provider    Answer:   Lavera Guise [[id number]]    Total time spent: 25 Minutes   Time spent includes review of chart, medications, test results, and follow up plan with the patient.      Dr Lavera Guise Internal medicine

## 2020-01-24 ENCOUNTER — Other Ambulatory Visit: Payer: Self-pay | Admitting: Nurse Practitioner

## 2020-01-25 LAB — COMPREHENSIVE METABOLIC PANEL
ALT: 15 IU/L (ref 0–32)
AST: 15 IU/L (ref 0–40)
Albumin/Globulin Ratio: 2.1 (ref 1.2–2.2)
Albumin: 4.2 g/dL (ref 3.8–4.8)
Alkaline Phosphatase: 54 IU/L (ref 44–121)
BUN/Creatinine Ratio: 15 (ref 9–23)
BUN: 10 mg/dL (ref 6–24)
Bilirubin Total: 0.6 mg/dL (ref 0.0–1.2)
CO2: 20 mmol/L (ref 20–29)
Calcium: 8.5 mg/dL — ABNORMAL LOW (ref 8.7–10.2)
Chloride: 106 mmol/L (ref 96–106)
Creatinine, Ser: 0.68 mg/dL (ref 0.57–1.00)
GFR calc Af Amer: 119 mL/min/{1.73_m2} (ref >59)
GFR calc non Af Amer: 103 mL/min/{1.73_m2} (ref >59)
Globulin, Total: 2 g/dL (ref 1.5–4.5)
Glucose: 90 mg/dL (ref 65–99)
Potassium: 4.7 mmol/L (ref 3.5–5.2)
Sodium: 140 mmol/L (ref 134–144)
Total Protein: 6.2 g/dL (ref 6.0–8.5)

## 2020-01-25 LAB — TSH: TSH: 2.07 u[IU]/mL (ref 0.450–4.500)

## 2020-01-25 LAB — CBC
Hematocrit: 35.6 % (ref 34.0–46.6)
Hemoglobin: 11.5 g/dL (ref 11.1–15.9)
MCH: 26.7 pg (ref 26.6–33.0)
MCHC: 32.3 g/dL (ref 31.5–35.7)
MCV: 83 fL (ref 79–97)
Platelets: 284 10*3/uL (ref 150–450)
RBC: 4.31 x10E6/uL (ref 3.77–5.28)
RDW: 17 % — ABNORMAL HIGH (ref 11.7–15.4)
WBC: 7 10*3/uL (ref 3.4–10.8)

## 2020-01-25 LAB — LIPID PANEL WITH LDL/HDL RATIO
Cholesterol, Total: 223 mg/dL — ABNORMAL HIGH (ref 100–199)
HDL: 36 mg/dL — ABNORMAL LOW (ref >39)
LDL Chol Calc (NIH): 165 mg/dL — ABNORMAL HIGH (ref 0–99)
LDL/HDL Ratio: 4.6 ratio — ABNORMAL HIGH (ref 0.0–3.2)
Triglycerides: 119 mg/dL (ref 0–149)
VLDL Cholesterol Cal: 22 mg/dL (ref 5–40)

## 2020-01-25 LAB — T4, FREE: Free T4: 1.19 ng/dL (ref 0.82–1.77)

## 2020-01-25 LAB — IRON AND TIBC
Iron Saturation: 14 % — ABNORMAL LOW (ref 15–55)
Iron: 47 ug/dL (ref 27–159)
Total Iron Binding Capacity: 344 ug/dL (ref 250–450)
UIBC: 297 ug/dL (ref 131–425)

## 2020-01-25 LAB — VITAMIN D 25 HYDROXY (VIT D DEFICIENCY, FRACTURES): Vit D, 25-Hydroxy: 27.3 ng/mL — ABNORMAL LOW (ref 30.0–100.0)

## 2020-01-25 LAB — B12 AND FOLATE PANEL
Folate: 8.5 ng/mL (ref >3.0)
Vitamin B-12: 286 pg/mL (ref 232–1245)

## 2020-01-25 LAB — FERRITIN: Ferritin: 21 ng/mL (ref 15–150)

## 2020-01-25 LAB — HGB A1C W/O EAG: Hgb A1c MFr Bld: 5.3 % (ref 4.8–5.6)

## 2020-01-28 NOTE — Progress Notes (Signed)
Can you let the patient know that her labs indicate mild to moderately elevated cholesterol. We can mail her a prudent diet, but she would also benefit from low dose crestor to prevent cardiovascular issues in future. I can send this in if she is willing to try it. Also, she would benefit from once monthly b12 injections. Please have beth or Myriam Jacobson get these set up for her. Thanks.

## 2020-01-29 ENCOUNTER — Telehealth: Payer: Self-pay

## 2020-01-29 ENCOUNTER — Ambulatory Visit (INDEPENDENT_AMBULATORY_CARE_PROVIDER_SITE_OTHER): Payer: PRIVATE HEALTH INSURANCE

## 2020-01-29 ENCOUNTER — Other Ambulatory Visit: Payer: Self-pay

## 2020-01-29 DIAGNOSIS — E538 Deficiency of other specified B group vitamins: Secondary | ICD-10-CM | POA: Diagnosis not present

## 2020-01-29 MED ORDER — CYANOCOBALAMIN 1000 MCG/ML IJ SOLN
1000.0000 ug | Freq: Once | INTRAMUSCULAR | Status: AC
  Administered 2020-01-29: 1000 ug via INTRAMUSCULAR

## 2020-01-29 NOTE — Telephone Encounter (Signed)
-----   Message from Ronnell Freshwater, NP sent at 01/28/2020  8:59 AM EST ----- Can you let the patient know that her labs indicate mild to moderately elevated cholesterol. We can mail her a prudent diet, but she would also benefit from low dose crestor to prevent cardiovascular issues in future. I can send this in if she is wil ling to try it. Also, she would benefit from once monthly b12 injections. Please have beth or Myriam Jacobson get these set up for her. Thanks.

## 2020-01-29 NOTE — Telephone Encounter (Signed)
Pt notified and she like to wait for chol med and also make appt for B12 and prudent diet for pickup

## 2020-02-28 ENCOUNTER — Other Ambulatory Visit: Payer: Self-pay

## 2020-02-28 ENCOUNTER — Ambulatory Visit: Payer: Self-pay

## 2020-02-28 DIAGNOSIS — E538 Deficiency of other specified B group vitamins: Secondary | ICD-10-CM

## 2020-02-28 MED ORDER — CYANOCOBALAMIN 1000 MCG/ML IJ SOLN
1000.0000 ug | Freq: Once | INTRAMUSCULAR | Status: AC
  Administered 2020-02-28: 1000 ug via INTRAMUSCULAR

## 2020-02-29 ENCOUNTER — Telehealth: Payer: Self-pay

## 2020-02-29 NOTE — Telephone Encounter (Signed)
Medical record request completed and mailed requesting records to [facility] [place] [id number].

## 2020-02-29 NOTE — Telephone Encounter (Signed)
Faxed medical record payment request to [facility] at [phone].

## 2020-03-28 ENCOUNTER — Other Ambulatory Visit: Payer: Self-pay

## 2020-03-28 ENCOUNTER — Ambulatory Visit (INDEPENDENT_AMBULATORY_CARE_PROVIDER_SITE_OTHER): Payer: 59

## 2020-03-28 DIAGNOSIS — E538 Deficiency of other specified B group vitamins: Secondary | ICD-10-CM | POA: Diagnosis not present

## 2020-03-28 MED ORDER — CYANOCOBALAMIN 1000 MCG/ML IJ SOLN
1000.0000 ug | Freq: Once | INTRAMUSCULAR | Status: AC
  Administered 2020-03-28: 1000 ug via INTRAMUSCULAR

## 2020-04-02 ENCOUNTER — Ambulatory Visit (INDEPENDENT_AMBULATORY_CARE_PROVIDER_SITE_OTHER): Payer: 59 | Admitting: Hospice and Palliative Medicine

## 2020-04-02 ENCOUNTER — Encounter: Payer: Self-pay | Admitting: Hospice and Palliative Medicine

## 2020-04-02 VITALS — Ht 62.0 in | Wt 194.0 lb

## 2020-04-02 DIAGNOSIS — R220 Localized swelling, mass and lump, head: Secondary | ICD-10-CM | POA: Diagnosis not present

## 2020-04-02 DIAGNOSIS — R590 Localized enlarged lymph nodes: Secondary | ICD-10-CM | POA: Diagnosis not present

## 2020-04-02 NOTE — Progress Notes (Signed)
[facility] [place], [place] [address]  Internal MEDICINE  Telephone Visit  Patient Name: Janet Henry  [id number]  [id number]  Date of Service: 04/05/2020  I connected with the patient at 1153 by telephone and verified the patients identity using two identifiers.   I discussed the limitations, risks, security and privacy concerns of performing an evaluation and management service by telephone and the availability of in person appointments. I also discussed with the patient that there may be a patient responsible charge related to the service.  The patient expressed understanding and agrees to proceed.    Chief Complaint  Patient presents with  . Telephone Assessment    [id number]  . Telephone Screen    Going on 2 days   . Headache  . Oral Swelling    Gum     HPI Patient is being seen virtually today for acute sick visit Dull headache that started yesterday--improved with acetaminophen Noticed a swollen lymph node on right side of her neck, one single node that is swollen, non-tender, denies sore throat, nasal or chest congestion Has not been exposed to anyone with or suspected of having COVID  Also noticed a that a kernel from popcorn was stuck in her upper left gum and her gumline at that area is now somewhat swollen, non-tender, no facial pain Denies fevers, cough, shortness of breath   Current Medication: Outpatient Encounter Medications as of 04/02/2020  Medication Sig  . atenolol (TENORMIN) 25 MG tablet Take 1 tablet (25 mg total) by mouth at bedtime.  . Norethindrone (NORLYDA PO) Take by mouth.   No facility-administered encounter medications on file as of 04/02/2020.    Surgical History: Past Surgical History:  Procedure Laterality Date  . CESAREAN SECTION      Medical History: Past Medical History:  Diagnosis Date  . Allergy   . Hypertension     Family History: Family History  Problem Relation Age of Onset  . Heart disease  Mother   . Hypertension Father     Social History   Socioeconomic History  . Marital status: Married    Spouse name: Not on file  . Number of children: Not on file  . Years of education: Not on file  . Highest education level: Not on file  Occupational History  . Not on file  Tobacco Use  . Smoking status: Never Smoker  . Smokeless tobacco: Never Used  Substance and Sexual Activity  . Alcohol use: No  . Drug use: No  . Sexual activity: Not on file  Other Topics Concern  . Not on file  Social History Narrative  . Not on file   Social Determinants of Health   Financial Resource Strain: Not on file  Food Insecurity: Not on file  Transportation Needs: Not on file  Physical Activity: Not on file  Stress: Not on file  Social Connections: Not on file  Intimate Partner Violence: Not on file      Review of Systems  Constitutional: Negative for chills, diaphoresis and fatigue.  HENT: Negative for ear pain, postnasal drip and sinus pressure.        Single swollen lymph to right side of neck  Eyes: Negative for photophobia, discharge, redness, itching and visual disturbance.  Respiratory: Negative for cough, shortness of breath and wheezing.   Cardiovascular: Negative for chest pain, palpitations and leg swelling.  Gastrointestinal: Negative for abdominal pain, constipation, diarrhea, nausea and vomiting.  Genitourinary: Negative for dysuria and flank  pain.  Musculoskeletal: Negative for arthralgias, back pain, gait problem and neck pain.  Skin: Negative for color change.  Allergic/Immunologic: Negative for environmental allergies and food allergies.  Neurological: Positive for headaches. Negative for dizziness.  Hematological: Does not bruise/bleed easily.  Psychiatric/Behavioral: Negative for agitation, behavioral problems (depression) and hallucinations.    Vital Signs: Ht 5' 2"  (1.575 m)   Wt 194 lb (88 kg)   BMI 35.48 kg/m    Observation/Objective: Alert and  oriented, answers questions appropriately, no distress noted.   Assessment/Plan: 1. Enlarged lymph node in neck Advised to monitor closely, scheduled for B12 injection in a few weeks, if lymph node remains swollen will need evaluation and consider ultrasound  2. Gingival swelling Minimal swelling and irritation due to corn kernel  Advised to monitor, if further symptoms develop consider antibiotic therapy and will need dental evaluation  General Counseling: Jeannine verbalizes understanding of the findings of today's phone visit and agrees with plan of treatment. I have discussed any further diagnostic evaluation that may be needed or ordered today. We also reviewed her medications today. she has been encouraged to call the office with any questions or concerns that should arise related to todays visit.   Time spent: 25 Minutes Time spent includes review of chart, medications, test results and follow-up plan with the patient.  Tanna Furry Tritia Endo AGNP-C Internal medicine

## 2020-04-05 ENCOUNTER — Encounter: Payer: Self-pay | Admitting: Hospice and Palliative Medicine

## 2020-04-17 ENCOUNTER — Telehealth: Payer: Self-pay

## 2020-04-17 NOTE — Telephone Encounter (Signed)
Mailed pt medical records to [facility] on 04/17/2020 to  [address] [place] [address]

## 2020-04-17 NOTE — Telephone Encounter (Signed)
Mailed records to [facility]

## 2020-04-28 ENCOUNTER — Ambulatory Visit (INDEPENDENT_AMBULATORY_CARE_PROVIDER_SITE_OTHER): Payer: 59

## 2020-04-28 DIAGNOSIS — E538 Deficiency of other specified B group vitamins: Secondary | ICD-10-CM

## 2020-04-28 MED ORDER — CYANOCOBALAMIN 1000 MCG/ML IJ SOLN
1000.0000 ug | Freq: Once | INTRAMUSCULAR | Status: AC
  Administered 2020-04-28: 1000 ug via INTRAMUSCULAR

## 2020-06-19 ENCOUNTER — Encounter: Payer: PRIVATE HEALTH INSURANCE | Admitting: Hospice and Palliative Medicine

## 2020-06-19 ENCOUNTER — Other Ambulatory Visit: Payer: Self-pay

## 2020-06-19 ENCOUNTER — Ambulatory Visit (INDEPENDENT_AMBULATORY_CARE_PROVIDER_SITE_OTHER): Payer: 59

## 2020-06-19 DIAGNOSIS — J301 Allergic rhinitis due to pollen: Secondary | ICD-10-CM

## 2020-06-19 MED ORDER — CYANOCOBALAMIN 1000 MCG/ML IJ SOLN
1000.0000 ug | Freq: Once | INTRAMUSCULAR | Status: AC
  Administered 2020-06-19: 1000 ug via INTRAMUSCULAR

## 2020-07-09 ENCOUNTER — Other Ambulatory Visit: Payer: Self-pay

## 2020-07-09 ENCOUNTER — Encounter: Payer: Self-pay | Admitting: Hospice and Palliative Medicine

## 2020-07-09 ENCOUNTER — Ambulatory Visit (INDEPENDENT_AMBULATORY_CARE_PROVIDER_SITE_OTHER): Payer: 59 | Admitting: Hospice and Palliative Medicine

## 2020-07-09 VITALS — BP 122/72 | HR 86 | Temp 97.9°F | Resp 16 | Ht 62.0 in | Wt 191.2 lb

## 2020-07-09 DIAGNOSIS — I10 Essential (primary) hypertension: Secondary | ICD-10-CM

## 2020-07-09 DIAGNOSIS — Z1211 Encounter for screening for malignant neoplasm of colon: Secondary | ICD-10-CM | POA: Diagnosis not present

## 2020-07-09 DIAGNOSIS — E559 Vitamin D deficiency, unspecified: Secondary | ICD-10-CM

## 2020-07-09 DIAGNOSIS — E782 Mixed hyperlipidemia: Secondary | ICD-10-CM

## 2020-07-09 MED ORDER — ATENOLOL 25 MG PO TABS: 25.0000 mg | ORAL_TABLET | Freq: Every day | ORAL | 1 refills | Status: AC

## 2020-07-09 NOTE — Progress Notes (Signed)
[facility] [place], [place] [address]  Internal MEDICINE  Office Visit Note  Patient Name: Janet Henry  [id number]  [id number]  Date of Service: 07/16/2020  Chief Complaint  Patient presents with  . Follow-up    refills  . Hypertension  . Quality Metric Gaps    Colonoscopy     HPI Patient is here for routine follow-up Overall, things have been going well No acute issues or complaints today Due for screening colonoscopy--she would prefer to do cologuard at this time  Reviewed most recent labs--abnormal lipid panel, vitamin d insufficiency  Requesting refills of atenolol  Current Medication: Outpatient Encounter Medications as of 07/09/2020  Medication Sig  . medroxyPROGESTERone (DEPO-PROVERA) 150 MG/ML injection Inject 150 mg into the muscle every 3 (three) months.  . norgestimate-ethinyl estradiol (ORTHO-CYCLEN) 0.25-35 MG-MCG tablet Take 1 tablet by mouth daily.  . [DISCONTINUED] atenolol (TENORMIN) 25 MG tablet Take 1 tablet (25 mg total) by mouth at bedtime.  Marland Kitchen atenolol (TENORMIN) 25 MG tablet Take 1 tablet (25 mg total) by mouth at bedtime.  . [DISCONTINUED] Norethindrone (NORLYDA PO) Take by mouth. (Patient not taking: Reported on 07/09/2020)   No facility-administered encounter medications on file as of 07/09/2020.    Surgical History: Past Surgical History:  Procedure Laterality Date  . CESAREAN SECTION    . LIPOSUCTION      Medical History: Past Medical History:  Diagnosis Date  . Allergy   . Hypertension     Family History: Family History  Problem Relation Age of Onset  . Heart disease Mother   . Hypertension Father     Social History   Socioeconomic History  . Marital status: Married    Spouse name: Not on file  . Number of children: Not on file  . Years of education: Not on file  . Highest education level: Not on file  Occupational History  . Not on file  Tobacco Use  . Smoking status: Never Smoker  .  Smokeless tobacco: Never Used  Substance and Sexual Activity  . Alcohol use: No  . Drug use: No  . Sexual activity: Not on file  Other Topics Concern  . Not on file  Social History Narrative  . Not on file   Social Determinants of Health   Financial Resource Strain: Not on file  Food Insecurity: Not on file  Transportation Needs: Not on file  Physical Activity: Not on file  Stress: Not on file  Social Connections: Not on file  Intimate Partner Violence: Not on file      Review of Systems  Constitutional: Negative for chills, diaphoresis and fatigue.  HENT: Negative for ear pain, postnasal drip and sinus pressure.   Eyes: Negative for photophobia, discharge, redness, itching and visual disturbance.  Respiratory: Negative for cough, shortness of breath and wheezing.   Cardiovascular: Negative for chest pain, palpitations and leg swelling.  Gastrointestinal: Negative for abdominal pain, constipation, diarrhea, nausea and vomiting.  Genitourinary: Negative for dysuria and flank pain.  Musculoskeletal: Negative for arthralgias, back pain, gait problem and neck pain.  Skin: Negative for color change.  Allergic/Immunologic: Negative for environmental allergies and food allergies.  Neurological: Negative for dizziness and headaches.  Hematological: Does not bruise/bleed easily.  Psychiatric/Behavioral: Negative for agitation, behavioral problems (depression) and hallucinations.    Vital Signs: BP 122/72   Pulse 86   Temp 97.9 F (36.6 C)   Resp 16   Ht 5' 2"  (1.575 m)   Wt 191  lb 3.2 oz (86.7 kg)   SpO2 99%   BMI 34.97 kg/m    Physical Exam Vitals reviewed.  Constitutional:      Appearance: Normal appearance. She is normal weight.  Cardiovascular:     Rate and Rhythm: Normal rate and regular rhythm.     Pulses: Normal pulses.     Heart sounds: Normal heart sounds.  Pulmonary:     Effort: Pulmonary effort is normal.     Breath sounds: Normal breath sounds.   Abdominal:     General: Abdomen is flat.     Palpations: Abdomen is soft.  Musculoskeletal:        General: Normal range of motion.     Cervical back: Normal range of motion.  Skin:    General: Skin is warm.  Neurological:     General: No focal deficit present.     Mental Status: She is alert and oriented to person, place, and time. Mental status is at baseline.  Psychiatric:        Mood and Affect: Mood normal.        Behavior: Behavior normal.        Thought Content: Thought content normal.        Judgment: Judgment normal.    Assessment/Plan: 1. Essential hypertension BP and HR remain well controlled, requesting refills - atenolol (TENORMIN) 25 MG tablet; Take 1 tablet (25 mg total) by mouth at bedtime.  Dispense: 90 tablet; Refill: 1  2. Mixed hyperlipidemia Discussed risks associated with abnormal lipid levels and appropriate lifestyle changes to improve levels Consider further vascular studies and initiating statin if no improvement in levels with lifestyle changes  3. Vitamin D insufficiency Encouraged to start OTC Vitamin D supplements Will need BMD with next mammogram screening  4. Screening for colon cancer Will be set up for Cologuard screening  General Counseling: kimm sider understanding of the findings of todays visit and agrees with plan of treatment. I have discussed any further diagnostic evaluation that may be needed or ordered today. We also reviewed her medications today. she has been encouraged to call the office with any questions or concerns that should arise related to todays visit.   Meds ordered this encounter  Medications  . atenolol (TENORMIN) 25 MG tablet    Sig: Take 1 tablet (25 mg total) by mouth at bedtime.    Dispense:  90 tablet    Refill:  1    Time spent:30 Minutes Time spent includes review of chart, medications, test results and follow-up plan with the patient.  This patient was seen by Theodoro Grist AGNP-C in  Collaboration with Dr Lavera Guise as a part of collaborative care agreement     Moani Weipert. Gerald Honea AGNP-C Internal medicine

## 2020-07-15 ENCOUNTER — Other Ambulatory Visit: Payer: Self-pay

## 2020-07-15 ENCOUNTER — Ambulatory Visit (INDEPENDENT_AMBULATORY_CARE_PROVIDER_SITE_OTHER): Payer: 59

## 2020-07-15 DIAGNOSIS — E538 Deficiency of other specified B group vitamins: Secondary | ICD-10-CM | POA: Diagnosis not present

## 2020-07-15 MED ORDER — CYANOCOBALAMIN 1000 MCG/ML IJ SOLN
1000.0000 ug | Freq: Once | INTRAMUSCULAR | Status: AC
  Administered 2020-07-15: 1000 ug via INTRAMUSCULAR

## 2020-07-16 ENCOUNTER — Encounter: Payer: Self-pay | Admitting: Hospice and Palliative Medicine

## 2020-10-09 ENCOUNTER — Other Ambulatory Visit: Payer: Self-pay | Admitting: Obstetrics and Gynecology

## 2020-12-30 ENCOUNTER — Other Ambulatory Visit: Payer: No Typology Code available for payment source

## 2021-01-09 ENCOUNTER — Ambulatory Visit: Admit: 2021-01-09 | Payer: No Typology Code available for payment source | Admitting: Obstetrics and Gynecology

## 2021-01-09 SURGERY — HYSTERECTOMY, TOTAL, ROBOT-ASSISTED, LAPAROSCOPIC, WITH BILATERAL SALPINGO-OOPHORECTOMY
Anesthesia: Choice | Laterality: Bilateral

## 2021-01-12 ENCOUNTER — Encounter: Payer: 59 | Admitting: Physician Assistant

## 2021-01-16 ENCOUNTER — Other Ambulatory Visit: Payer: Self-pay | Admitting: Family Medicine

## 2021-01-16 DIAGNOSIS — Z1231 Encounter for screening mammogram for malignant neoplasm of breast: Secondary | ICD-10-CM

## 2021-12-03 ENCOUNTER — Ambulatory Visit: Payer: 59 | Admitting: Dermatology

## 2021-12-03 DIAGNOSIS — L821 Other seborrheic keratosis: Secondary | ICD-10-CM

## 2021-12-03 DIAGNOSIS — L918 Other hypertrophic disorders of the skin: Secondary | ICD-10-CM | POA: Diagnosis not present

## 2021-12-03 DIAGNOSIS — L820 Inflamed seborrheic keratosis: Secondary | ICD-10-CM

## 2021-12-03 NOTE — Progress Notes (Signed)
   New Patient Visit  Subjective  Janet Henry is a 51 y.o. female who presents for the following: Other (Skin tags left braline and groin area). The patient has spots, moles and lesions to be evaluated, some may be new or changing and the patient has concerns.   The following portions of the chart were reviewed this encounter and updated as appropriate:   Tobacco  Allergies  Meds  Problems  Med Hx  Surg Hx  Fam Hx     Review of Systems:  No other skin or systemic complaints except as noted in HPI or Assessment and Plan.  Objective  Well appearing patient in no apparent distress; mood and affect are within normal limits.  A focused examination was performed including trunk, groin. Relevant physical exam findings are noted in the Assessment and Plan.  Right Lower Back Erythematous stuck-on, waxy papule or plaque  Left Flank, Left Medial Thigh Fleshy, skin-colored pedunculated papules.     Assessment & Plan  Inflamed seborrheic keratosis Right Lower Back  Destruction of lesion - Right Lower Back Complexity: simple   Destruction method: cryotherapy   Informed consent: discussed and consent obtained   Timeout:  patient name, date of birth, surgical site, and procedure verified Lesion destroyed using liquid nitrogen: Yes   Region frozen until ice ball extended beyond lesion: Yes   Outcome: patient tolerated procedure well with no complications   Post-procedure details: wound care instructions given    Skin tag (2) Left Flank; Left Medial Thigh  Acrochordons (Skin Tags) - Removal desired by patient - Fleshy, skin-colored pedunculated papules - Benign appearing.  - Patient desires removal. Reviewed that this is not covered by insurance and they will be charged a cosmetic fee for removal. Patient signed non-covered consent.  - Prior to the procedure, reviewed the expected small wound. Also reviewed the risk of leaving a small scar and the small risk of infection.   PROCEDURE - The areas were prepped with isopropyl alcohol. A small amount of lidocaine 1% with epinephrine was injected at the base of each lesion to achieve good local anesthesia. The skin tags were removed using a snip technique. Aluminum chloride was used for hemostasis. Petrolatum and a bandage were applied. The procedure was tolerated well. - Wound care was reviewed with the patient. They were advised to call with any concerns. Total number of treated acrochordons 5 - left flank x 2, left groin x 3   Seborrheic Keratoses - Stuck-on, waxy, tan-brown papules and/or plaques  - Benign-appearing - Discussed benign etiology and prognosis. - Observe - Call for any changes  Return if symptoms worsen or fail to improve.  I, Ashok Cordia, CMA, am acting as scribe for Sarina Ser, MD . Documentation: I have reviewed the above documentation for accuracy and completeness, and I agree with the above.  Sarina Ser, MD

## 2021-12-08 ENCOUNTER — Encounter: Payer: Self-pay | Admitting: Dermatology

## 2022-01-25 ENCOUNTER — Other Ambulatory Visit: Payer: Self-pay | Admitting: Family Medicine

## 2022-01-25 DIAGNOSIS — E559 Vitamin D deficiency, unspecified: Secondary | ICD-10-CM | POA: Diagnosis not present

## 2022-01-25 DIAGNOSIS — R002 Palpitations: Secondary | ICD-10-CM | POA: Diagnosis not present

## 2022-01-25 DIAGNOSIS — R0602 Shortness of breath: Secondary | ICD-10-CM | POA: Diagnosis not present

## 2022-01-25 DIAGNOSIS — R0789 Other chest pain: Secondary | ICD-10-CM | POA: Diagnosis not present

## 2022-01-25 DIAGNOSIS — Z0000 Encounter for general adult medical examination without abnormal findings: Secondary | ICD-10-CM | POA: Diagnosis not present

## 2022-01-25 DIAGNOSIS — R5383 Other fatigue: Secondary | ICD-10-CM | POA: Diagnosis not present

## 2022-01-25 DIAGNOSIS — Z1231 Encounter for screening mammogram for malignant neoplasm of breast: Secondary | ICD-10-CM | POA: Diagnosis not present

## 2022-01-25 DIAGNOSIS — E538 Deficiency of other specified B group vitamins: Secondary | ICD-10-CM | POA: Diagnosis not present

## 2022-03-03 DIAGNOSIS — R0789 Other chest pain: Secondary | ICD-10-CM | POA: Diagnosis not present

## 2022-03-03 DIAGNOSIS — R0602 Shortness of breath: Secondary | ICD-10-CM | POA: Diagnosis not present

## 2022-03-24 DIAGNOSIS — R5382 Chronic fatigue, unspecified: Secondary | ICD-10-CM | POA: Diagnosis not present

## 2022-03-24 DIAGNOSIS — R0602 Shortness of breath: Secondary | ICD-10-CM | POA: Diagnosis not present

## 2022-03-24 DIAGNOSIS — I10 Essential (primary) hypertension: Secondary | ICD-10-CM | POA: Diagnosis not present

## 2022-03-24 DIAGNOSIS — R002 Palpitations: Secondary | ICD-10-CM | POA: Diagnosis not present

## 2022-03-24 DIAGNOSIS — R9439 Abnormal result of other cardiovascular function study: Secondary | ICD-10-CM | POA: Diagnosis not present

## 2022-03-29 ENCOUNTER — Other Ambulatory Visit: Payer: Self-pay | Admitting: Physician Assistant

## 2022-03-29 DIAGNOSIS — R0602 Shortness of breath: Secondary | ICD-10-CM

## 2022-03-29 DIAGNOSIS — R9439 Abnormal result of other cardiovascular function study: Secondary | ICD-10-CM

## 2022-03-29 DIAGNOSIS — I10 Essential (primary) hypertension: Secondary | ICD-10-CM

## 2022-04-09 ENCOUNTER — Telehealth (HOSPITAL_COMMUNITY): Payer: Self-pay | Admitting: Emergency Medicine

## 2022-04-09 DIAGNOSIS — R079 Chest pain, unspecified: Secondary | ICD-10-CM

## 2022-04-09 MED ORDER — METOPROLOL TARTRATE 100 MG PO TABS: 100.0000 mg | ORAL_TABLET | Freq: Once | ORAL | 0 refills | Status: AC

## 2022-04-09 NOTE — Telephone Encounter (Signed)
Reaching out to patient to offer assistance regarding upcoming cardiac imaging study; pt verbalizes understanding of appt date/time, parking situation and where to check in, pre-test NPO status and medications ordered, and verified current allergies; name and call back number provided for further questions should they arise Marchia Bond RN Navigator Cardiac Imaging [facility] [phone] office [phone] cell  '100mg'$  metoprolol tartrate sent to pharm Arrival [address] [facility] med mall Denies iv issues  Aware contrast/nitro

## 2022-04-12 ENCOUNTER — Ambulatory Visit
Admission: RE | Admit: 2022-04-12 | Discharge: 2022-04-12 | Disposition: A | Discharge status: Home / Self Care | Payer: 59 | Source: Ambulatory Visit | Attending: Physician Assistant | Admitting: Physician Assistant

## 2022-04-12 DIAGNOSIS — R9430 Abnormal result of cardiovascular function study, unspecified: Secondary | ICD-10-CM | POA: Diagnosis not present

## 2022-04-12 DIAGNOSIS — R0602 Shortness of breath: Secondary | ICD-10-CM | POA: Diagnosis not present

## 2022-04-12 DIAGNOSIS — I10 Essential (primary) hypertension: Secondary | ICD-10-CM | POA: Diagnosis not present

## 2022-04-12 DIAGNOSIS — R9439 Abnormal result of other cardiovascular function study: Secondary | ICD-10-CM

## 2022-04-12 LAB — POCT I-STAT CREATININE: Creatinine, Ser: 0.7 mg/dL (ref 0.44–1.00)

## 2022-04-12 MED ORDER — NITROGLYCERIN 0.4 MG SL SUBL
SUBLINGUAL_TABLET | SUBLINGUAL | Status: AC
  Filled 2022-04-12: qty 2

## 2022-04-12 MED ORDER — IOHEXOL 350 MG/ML SOLN
100.0000 mL | Freq: Once | INTRAVENOUS | Status: AC | PRN
  Administered 2022-04-12: 100 mL via INTRAVENOUS

## 2022-04-12 MED ORDER — METOPROLOL TARTRATE 5 MG/5ML IV SOLN
10.0000 mg | Freq: Once | INTRAVENOUS | Status: AC
  Administered 2022-04-12: 10 mg via INTRAVENOUS
  Filled 2022-04-12: qty 10

## 2022-04-12 MED ORDER — NITROGLYCERIN 0.4 MG SL SUBL
0.8000 mg | SUBLINGUAL_TABLET | Freq: Once | SUBLINGUAL | Status: AC
  Administered 2022-04-12: 0.8 mg via SUBLINGUAL
  Filled 2022-04-12: qty 25

## 2022-04-12 MED ORDER — METOPROLOL TARTRATE 5 MG/5ML IV SOLN
INTRAVENOUS | Status: AC
  Filled 2022-04-12: qty 10

## 2022-04-12 NOTE — Progress Notes (Signed)
Patient tolerated procedure well. Ambulate w/o difficulty. Denies any lightheadedness or being dizzy. Pt denies any pain at this time. Sitting in chair, pt is encouraged to drink additional water throughout the day and reason explained to patient. Patient verbalized understanding and all questions answered. ABC intact. No further needs at this time. Discharge from procedure area w/o issues.  

## 2022-04-29 DIAGNOSIS — E782 Mixed hyperlipidemia: Secondary | ICD-10-CM | POA: Diagnosis not present

## 2022-04-29 DIAGNOSIS — E538 Deficiency of other specified B group vitamins: Secondary | ICD-10-CM | POA: Diagnosis not present

## 2022-04-29 DIAGNOSIS — I10 Essential (primary) hypertension: Secondary | ICD-10-CM | POA: Diagnosis not present

## 2022-04-29 DIAGNOSIS — Z0000 Encounter for general adult medical examination without abnormal findings: Secondary | ICD-10-CM | POA: Diagnosis not present

## 2022-04-29 DIAGNOSIS — Z6838 Body mass index (BMI) 38.0-38.9, adult: Secondary | ICD-10-CM | POA: Diagnosis not present

## 2022-04-29 DIAGNOSIS — E559 Vitamin D deficiency, unspecified: Secondary | ICD-10-CM | POA: Diagnosis not present

## 2022-05-07 DIAGNOSIS — Z0000 Encounter for general adult medical examination without abnormal findings: Secondary | ICD-10-CM | POA: Diagnosis not present

## 2022-05-07 DIAGNOSIS — E559 Vitamin D deficiency, unspecified: Secondary | ICD-10-CM | POA: Diagnosis not present

## 2022-05-07 DIAGNOSIS — E538 Deficiency of other specified B group vitamins: Secondary | ICD-10-CM | POA: Diagnosis not present

## 2022-05-07 DIAGNOSIS — I10 Essential (primary) hypertension: Secondary | ICD-10-CM | POA: Diagnosis not present

## 2022-05-07 DIAGNOSIS — Z13220 Encounter for screening for lipoid disorders: Secondary | ICD-10-CM | POA: Diagnosis not present

## 2022-05-07 DIAGNOSIS — Z6838 Body mass index (BMI) 38.0-38.9, adult: Secondary | ICD-10-CM | POA: Diagnosis not present

## 2022-05-18 DIAGNOSIS — I10 Essential (primary) hypertension: Secondary | ICD-10-CM | POA: Diagnosis not present

## 2022-07-02 ENCOUNTER — Encounter: Payer: Self-pay | Admitting: Radiology

## 2022-07-02 ENCOUNTER — Ambulatory Visit
Admission: RE | Admit: 2022-07-02 | Discharge: 2022-07-02 | Disposition: A | Discharge status: Home / Self Care | Payer: 59 | Source: Ambulatory Visit | Attending: Family Medicine | Admitting: Family Medicine

## 2022-07-02 DIAGNOSIS — Z1231 Encounter for screening mammogram for malignant neoplasm of breast: Secondary | ICD-10-CM | POA: Diagnosis not present

## 2022-09-19 DIAGNOSIS — N946 Dysmenorrhea, unspecified: Secondary | ICD-10-CM | POA: Diagnosis not present

## 2022-09-19 DIAGNOSIS — R9389 Abnormal findings on diagnostic imaging of other specified body structures: Secondary | ICD-10-CM | POA: Diagnosis not present

## 2022-09-19 DIAGNOSIS — D259 Leiomyoma of uterus, unspecified: Secondary | ICD-10-CM | POA: Diagnosis not present

## 2022-09-19 DIAGNOSIS — R102 Pelvic and perineal pain: Secondary | ICD-10-CM | POA: Diagnosis not present

## 2022-09-20 DIAGNOSIS — R102 Pelvic and perineal pain: Secondary | ICD-10-CM | POA: Diagnosis not present

## 2022-09-20 DIAGNOSIS — R9389 Abnormal findings on diagnostic imaging of other specified body structures: Secondary | ICD-10-CM | POA: Diagnosis not present

## 2022-09-21 DIAGNOSIS — R9389 Abnormal findings on diagnostic imaging of other specified body structures: Secondary | ICD-10-CM | POA: Diagnosis not present

## 2022-09-21 DIAGNOSIS — N946 Dysmenorrhea, unspecified: Secondary | ICD-10-CM | POA: Diagnosis not present

## 2022-09-21 DIAGNOSIS — D259 Leiomyoma of uterus, unspecified: Secondary | ICD-10-CM | POA: Diagnosis not present

## 2022-10-18 DIAGNOSIS — N939 Abnormal uterine and vaginal bleeding, unspecified: Secondary | ICD-10-CM | POA: Diagnosis not present

## 2022-10-25 DIAGNOSIS — E538 Deficiency of other specified B group vitamins: Secondary | ICD-10-CM | POA: Diagnosis not present

## 2022-10-25 DIAGNOSIS — I10 Essential (primary) hypertension: Secondary | ICD-10-CM | POA: Diagnosis not present

## 2022-10-25 DIAGNOSIS — E782 Mixed hyperlipidemia: Secondary | ICD-10-CM | POA: Diagnosis not present

## 2022-10-25 DIAGNOSIS — R002 Palpitations: Secondary | ICD-10-CM | POA: Diagnosis not present

## 2022-10-25 DIAGNOSIS — N939 Abnormal uterine and vaginal bleeding, unspecified: Secondary | ICD-10-CM | POA: Diagnosis not present

## 2022-10-25 DIAGNOSIS — E559 Vitamin D deficiency, unspecified: Secondary | ICD-10-CM | POA: Diagnosis not present

## 2022-11-09 DIAGNOSIS — N938 Other specified abnormal uterine and vaginal bleeding: Secondary | ICD-10-CM | POA: Diagnosis not present

## 2022-11-09 DIAGNOSIS — N939 Abnormal uterine and vaginal bleeding, unspecified: Secondary | ICD-10-CM | POA: Diagnosis not present

## 2023-02-08 DIAGNOSIS — R300 Dysuria: Secondary | ICD-10-CM | POA: Diagnosis not present

## 2023-02-08 DIAGNOSIS — R399 Unspecified symptoms and signs involving the genitourinary system: Secondary | ICD-10-CM | POA: Diagnosis not present

## 2023-05-02 DIAGNOSIS — R002 Palpitations: Secondary | ICD-10-CM | POA: Diagnosis not present

## 2023-05-02 DIAGNOSIS — Z0000 Encounter for general adult medical examination without abnormal findings: Secondary | ICD-10-CM | POA: Diagnosis not present

## 2023-05-02 DIAGNOSIS — E538 Deficiency of other specified B group vitamins: Secondary | ICD-10-CM | POA: Diagnosis not present

## 2023-05-02 DIAGNOSIS — E782 Mixed hyperlipidemia: Secondary | ICD-10-CM | POA: Diagnosis not present

## 2023-05-02 DIAGNOSIS — I10 Essential (primary) hypertension: Secondary | ICD-10-CM | POA: Diagnosis not present

## 2023-05-02 DIAGNOSIS — R300 Dysuria: Secondary | ICD-10-CM | POA: Diagnosis not present

## 2023-05-02 DIAGNOSIS — Z1211 Encounter for screening for malignant neoplasm of colon: Secondary | ICD-10-CM | POA: Diagnosis not present

## 2023-05-02 DIAGNOSIS — E559 Vitamin D deficiency, unspecified: Secondary | ICD-10-CM | POA: Diagnosis not present

## 2023-05-10 DIAGNOSIS — Z1211 Encounter for screening for malignant neoplasm of colon: Secondary | ICD-10-CM | POA: Diagnosis not present

## 2023-05-31 DIAGNOSIS — R0981 Nasal congestion: Secondary | ICD-10-CM | POA: Diagnosis not present

## 2023-05-31 DIAGNOSIS — R058 Other specified cough: Secondary | ICD-10-CM | POA: Diagnosis not present

## 2023-05-31 DIAGNOSIS — J019 Acute sinusitis, unspecified: Secondary | ICD-10-CM | POA: Diagnosis not present

## 2024-02-07 DIAGNOSIS — R5382 Chronic fatigue, unspecified: Secondary | ICD-10-CM | POA: Diagnosis not present

## 2024-02-07 DIAGNOSIS — E559 Vitamin D deficiency, unspecified: Secondary | ICD-10-CM | POA: Diagnosis not present

## 2024-02-07 DIAGNOSIS — R7301 Impaired fasting glucose: Secondary | ICD-10-CM | POA: Diagnosis not present

## 2024-02-07 DIAGNOSIS — R635 Abnormal weight gain: Secondary | ICD-10-CM | POA: Diagnosis not present

## 2024-02-07 DIAGNOSIS — I10 Essential (primary) hypertension: Secondary | ICD-10-CM | POA: Diagnosis not present

## 2024-02-07 DIAGNOSIS — G8929 Other chronic pain: Secondary | ICD-10-CM | POA: Diagnosis not present

## 2024-02-07 DIAGNOSIS — E782 Mixed hyperlipidemia: Secondary | ICD-10-CM | POA: Diagnosis not present

## 2024-02-07 DIAGNOSIS — Z6837 Body mass index (BMI) 37.0-37.9, adult: Secondary | ICD-10-CM | POA: Diagnosis not present

## 2024-02-07 DIAGNOSIS — M79645 Pain in left finger(s): Secondary | ICD-10-CM | POA: Diagnosis not present

## 2024-02-22 DIAGNOSIS — M654 Radial styloid tenosynovitis [de Quervain]: Secondary | ICD-10-CM | POA: Diagnosis not present
